# Patient Record
Sex: Male | Born: 1940 | Race: White | Hispanic: No | Marital: Single | Smoking: Never smoker
Health system: Southern US, Community
[De-identification: ages and names within clinical notes are randomized; demographics above are authoritative.]

## PROBLEM LIST (undated history)

## (undated) DIAGNOSIS — H9190 Unspecified hearing loss, unspecified ear: Secondary | ICD-10-CM

## (undated) DIAGNOSIS — H409 Unspecified glaucoma: Secondary | ICD-10-CM

## (undated) DIAGNOSIS — G629 Polyneuropathy, unspecified: Secondary | ICD-10-CM

## (undated) DIAGNOSIS — I10 Essential (primary) hypertension: Secondary | ICD-10-CM

## (undated) DIAGNOSIS — M51369 Other intervertebral disc degeneration, lumbar region without mention of lumbar back pain or lower extremity pain: Secondary | ICD-10-CM

## (undated) DIAGNOSIS — K2270 Barrett's esophagus without dysplasia: Secondary | ICD-10-CM

## (undated) DIAGNOSIS — K259 Gastric ulcer, unspecified as acute or chronic, without hemorrhage or perforation: Secondary | ICD-10-CM

## (undated) DIAGNOSIS — N200 Calculus of kidney: Secondary | ICD-10-CM

## (undated) DIAGNOSIS — Z8739 Personal history of other diseases of the musculoskeletal system and connective tissue: Secondary | ICD-10-CM

## (undated) DIAGNOSIS — D869 Sarcoidosis, unspecified: Secondary | ICD-10-CM

## (undated) DIAGNOSIS — E785 Hyperlipidemia, unspecified: Secondary | ICD-10-CM

## (undated) DIAGNOSIS — M5136 Other intervertebral disc degeneration, lumbar region: Secondary | ICD-10-CM

## (undated) DIAGNOSIS — E119 Type 2 diabetes mellitus without complications: Secondary | ICD-10-CM

## (undated) HISTORY — PX: LYMPH NODE DISSECTION: SHX5087

## (undated) HISTORY — PX: APPENDECTOMY: SHX54

## (undated) HISTORY — PX: NECK SURGERY: SHX720

## (undated) HISTORY — PX: CATARACT EXTRACTION: SUR2

## (undated) HISTORY — PX: BACK SURGERY: SHX140

## (undated) HISTORY — PX: ORIF ANKLE FRACTURE: SHX5408

## (undated) HISTORY — PX: OTHER SURGICAL HISTORY: SHX169

---

## 2003-08-14 ENCOUNTER — Encounter: Admission: RE | Admit: 2003-08-14 | Discharge: 2003-08-14 | Payer: Self-pay | Admitting: Neurosurgery

## 2003-08-28 ENCOUNTER — Encounter: Admission: RE | Admit: 2003-08-28 | Discharge: 2003-08-28 | Payer: Self-pay | Admitting: Neurosurgery

## 2004-02-04 ENCOUNTER — Ambulatory Visit: Payer: Self-pay | Admitting: Internal Medicine

## 2004-03-30 ENCOUNTER — Ambulatory Visit: Payer: Self-pay | Admitting: Internal Medicine

## 2004-06-07 ENCOUNTER — Ambulatory Visit: Payer: Self-pay | Admitting: Family Medicine

## 2004-06-09 ENCOUNTER — Ambulatory Visit: Payer: Self-pay | Admitting: Family Medicine

## 2004-07-29 ENCOUNTER — Ambulatory Visit: Payer: Self-pay | Admitting: Internal Medicine

## 2005-01-27 ENCOUNTER — Ambulatory Visit: Payer: Self-pay | Admitting: Internal Medicine

## 2006-06-20 ENCOUNTER — Ambulatory Visit: Payer: Self-pay

## 2006-07-31 ENCOUNTER — Encounter: Payer: Self-pay | Admitting: Internal Medicine

## 2006-09-04 ENCOUNTER — Ambulatory Visit: Payer: Self-pay | Admitting: Internal Medicine

## 2006-10-05 DIAGNOSIS — E785 Hyperlipidemia, unspecified: Secondary | ICD-10-CM

## 2006-10-05 DIAGNOSIS — Z860100 Personal history of colon polyps, unspecified: Secondary | ICD-10-CM | POA: Insufficient documentation

## 2006-10-05 DIAGNOSIS — D869 Sarcoidosis, unspecified: Secondary | ICD-10-CM

## 2006-10-05 DIAGNOSIS — I10 Essential (primary) hypertension: Secondary | ICD-10-CM | POA: Insufficient documentation

## 2006-10-05 DIAGNOSIS — E1165 Type 2 diabetes mellitus with hyperglycemia: Secondary | ICD-10-CM

## 2006-10-05 DIAGNOSIS — K5730 Diverticulosis of large intestine without perforation or abscess without bleeding: Secondary | ICD-10-CM | POA: Insufficient documentation

## 2006-10-12 ENCOUNTER — Ambulatory Visit: Payer: Self-pay | Admitting: Internal Medicine

## 2006-11-08 ENCOUNTER — Ambulatory Visit (HOSPITAL_COMMUNITY): Admission: RE | Admit: 2006-11-08 | Discharge: 2006-11-08 | Payer: Self-pay | Admitting: Neurosurgery

## 2006-12-06 ENCOUNTER — Encounter: Payer: Self-pay | Admitting: Neurosurgery

## 2006-12-13 ENCOUNTER — Encounter: Payer: Self-pay | Admitting: Neurosurgery

## 2007-02-14 ENCOUNTER — Encounter (INDEPENDENT_AMBULATORY_CARE_PROVIDER_SITE_OTHER): Payer: Self-pay | Admitting: *Deleted

## 2007-04-13 ENCOUNTER — Ambulatory Visit: Payer: Self-pay | Admitting: Internal Medicine

## 2007-04-13 DIAGNOSIS — R109 Unspecified abdominal pain: Secondary | ICD-10-CM | POA: Insufficient documentation

## 2007-05-16 ENCOUNTER — Ambulatory Visit: Payer: Self-pay | Admitting: Internal Medicine

## 2007-05-16 DIAGNOSIS — F329 Major depressive disorder, single episode, unspecified: Secondary | ICD-10-CM

## 2007-05-16 DIAGNOSIS — E1149 Type 2 diabetes mellitus with other diabetic neurological complication: Secondary | ICD-10-CM

## 2007-05-17 LAB — CONVERTED CEMR LAB
Eosinophils Relative: 0.7 % (ref 0.0–5.0)
GFR calc Af Amer: 71 mL/min
GFR calc non Af Amer: 59 mL/min
Glucose, Bld: 496 mg/dL — ABNORMAL HIGH (ref 70–99)
Hgb A1c MFr Bld: 12.8 % — ABNORMAL HIGH (ref 4.6–6.0)
Lymphocytes Relative: 25.5 % (ref 12.0–46.0)
MCHC: 33.6 g/dL (ref 30.0–36.0)
MCV: 89 fL (ref 78.0–100.0)
Microalb, Ur: 3.3 mg/dL — ABNORMAL HIGH (ref 0.0–1.9)
Monocytes Relative: 4.7 % (ref 3.0–11.0)
Neutrophils Relative %: 68.6 % (ref 43.0–77.0)
PSA: 0.51 ng/mL (ref 0.10–4.00)
Phosphorus: 5.2 mg/dL — ABNORMAL HIGH (ref 2.3–4.6)
Platelets: 222 10*3/uL (ref 150–400)
Potassium: 4.4 meq/L (ref 3.5–5.1)
Sodium: 130 meq/L — ABNORMAL LOW (ref 135–145)
TSH: 1.9 microintl units/mL (ref 0.35–5.50)

## 2007-05-18 ENCOUNTER — Encounter: Payer: Self-pay | Admitting: Internal Medicine

## 2007-05-31 ENCOUNTER — Ambulatory Visit: Payer: Self-pay | Admitting: Internal Medicine

## 2007-06-05 ENCOUNTER — Other Ambulatory Visit: Payer: Self-pay

## 2007-06-05 ENCOUNTER — Inpatient Hospital Stay: Payer: Self-pay | Admitting: Internal Medicine

## 2007-06-06 ENCOUNTER — Encounter: Payer: Self-pay | Admitting: Internal Medicine

## 2007-06-08 ENCOUNTER — Encounter: Payer: Self-pay | Admitting: Internal Medicine

## 2007-06-11 ENCOUNTER — Encounter: Payer: Self-pay | Admitting: Internal Medicine

## 2007-06-12 ENCOUNTER — Telehealth: Payer: Self-pay | Admitting: Internal Medicine

## 2007-06-12 ENCOUNTER — Encounter: Payer: Self-pay | Admitting: Internal Medicine

## 2007-06-15 DIAGNOSIS — M8618 Other acute osteomyelitis, other site: Secondary | ICD-10-CM

## 2007-06-18 ENCOUNTER — Ambulatory Visit: Payer: Self-pay | Admitting: Internal Medicine

## 2007-06-18 DIAGNOSIS — B951 Streptococcus, group B, as the cause of diseases classified elsewhere: Secondary | ICD-10-CM

## 2007-06-18 DIAGNOSIS — K259 Gastric ulcer, unspecified as acute or chronic, without hemorrhage or perforation: Secondary | ICD-10-CM | POA: Insufficient documentation

## 2007-06-19 ENCOUNTER — Encounter: Payer: Self-pay | Admitting: Internal Medicine

## 2007-07-04 ENCOUNTER — Ambulatory Visit: Payer: Self-pay | Admitting: Internal Medicine

## 2007-07-04 ENCOUNTER — Emergency Department: Payer: Self-pay | Admitting: Unknown Physician Specialty

## 2007-07-04 ENCOUNTER — Telehealth: Payer: Self-pay | Admitting: Internal Medicine

## 2007-07-04 ENCOUNTER — Ambulatory Visit: Payer: Self-pay | Admitting: Pulmonary Disease

## 2007-07-04 ENCOUNTER — Inpatient Hospital Stay (HOSPITAL_COMMUNITY): Admission: EM | Admit: 2007-07-04 | Discharge: 2007-07-18 | Payer: Self-pay | Admitting: Emergency Medicine

## 2007-07-04 ENCOUNTER — Encounter: Payer: Self-pay | Admitting: Internal Medicine

## 2007-07-05 ENCOUNTER — Ambulatory Visit: Payer: Self-pay | Admitting: Internal Medicine

## 2007-07-10 ENCOUNTER — Telehealth (INDEPENDENT_AMBULATORY_CARE_PROVIDER_SITE_OTHER): Payer: Self-pay | Admitting: *Deleted

## 2007-07-13 ENCOUNTER — Encounter (INDEPENDENT_AMBULATORY_CARE_PROVIDER_SITE_OTHER): Payer: Self-pay | Admitting: Neurosurgery

## 2007-07-13 ENCOUNTER — Encounter: Payer: Self-pay | Admitting: Internal Medicine

## 2007-07-18 ENCOUNTER — Encounter: Payer: Self-pay | Admitting: Internal Medicine

## 2007-07-19 ENCOUNTER — Encounter (INDEPENDENT_AMBULATORY_CARE_PROVIDER_SITE_OTHER): Payer: Self-pay | Admitting: *Deleted

## 2007-07-23 ENCOUNTER — Encounter: Payer: Self-pay | Admitting: Infectious Diseases

## 2007-07-23 ENCOUNTER — Ambulatory Visit: Payer: Self-pay

## 2007-07-23 ENCOUNTER — Encounter: Payer: Self-pay | Admitting: Internal Medicine

## 2007-07-31 ENCOUNTER — Encounter: Payer: Self-pay | Admitting: Internal Medicine

## 2007-07-31 ENCOUNTER — Ambulatory Visit: Payer: Self-pay

## 2007-08-02 ENCOUNTER — Encounter: Payer: Self-pay | Admitting: Internal Medicine

## 2007-08-03 ENCOUNTER — Ambulatory Visit: Payer: Self-pay | Admitting: Internal Medicine

## 2007-08-08 ENCOUNTER — Telehealth: Payer: Self-pay | Admitting: Internal Medicine

## 2007-08-13 ENCOUNTER — Encounter: Payer: Self-pay | Admitting: Internal Medicine

## 2007-08-14 ENCOUNTER — Encounter: Payer: Self-pay | Admitting: Internal Medicine

## 2007-08-24 ENCOUNTER — Encounter: Payer: Self-pay | Admitting: Internal Medicine

## 2007-09-07 ENCOUNTER — Encounter: Payer: Self-pay | Admitting: Internal Medicine

## 2007-09-11 ENCOUNTER — Encounter: Payer: Self-pay | Admitting: Infectious Diseases

## 2007-09-11 ENCOUNTER — Encounter: Payer: Self-pay | Admitting: Internal Medicine

## 2007-09-12 ENCOUNTER — Inpatient Hospital Stay (HOSPITAL_COMMUNITY): Admission: AD | Admit: 2007-09-12 | Discharge: 2007-09-19 | Payer: Self-pay | Admitting: Neurosurgery

## 2007-10-08 ENCOUNTER — Ambulatory Visit: Payer: Self-pay | Admitting: Internal Medicine

## 2008-02-22 ENCOUNTER — Ambulatory Visit: Payer: Self-pay | Admitting: Family Medicine

## 2008-02-22 DIAGNOSIS — T23219A Burn of second degree of unspecified thumb (nail), initial encounter: Secondary | ICD-10-CM | POA: Insufficient documentation

## 2008-02-25 ENCOUNTER — Ambulatory Visit: Payer: Self-pay | Admitting: Internal Medicine

## 2008-02-25 DIAGNOSIS — L02519 Cutaneous abscess of unspecified hand: Secondary | ICD-10-CM

## 2008-02-25 DIAGNOSIS — L03119 Cellulitis of unspecified part of limb: Secondary | ICD-10-CM

## 2008-09-18 ENCOUNTER — Emergency Department: Payer: Self-pay | Admitting: Emergency Medicine

## 2009-11-10 IMAGING — CT CT CERVICAL SPINE WITHOUT CONTRAST
1 series · 12 of 14 positions shown, 15 images · non-contrast
Comparison: none

REASON FOR EXAM: neck pain arm pain
COMMENTS:

[Series 5: axial · axial · 0.31mm/px · z∈[+558,+727]mm · 12 of 107 slices shown, 15 images]
[im 9/107  soft-tissue]
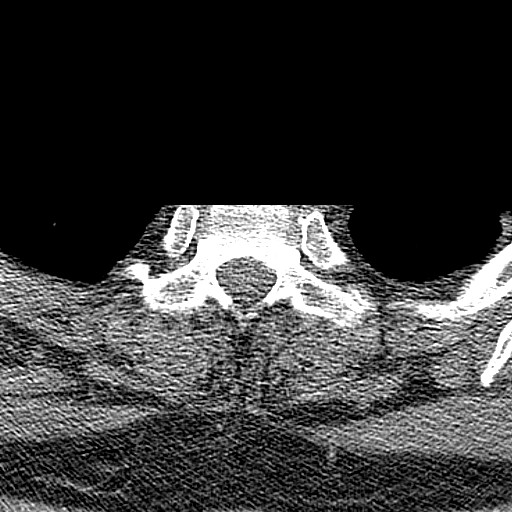
[im 9/107  bone]
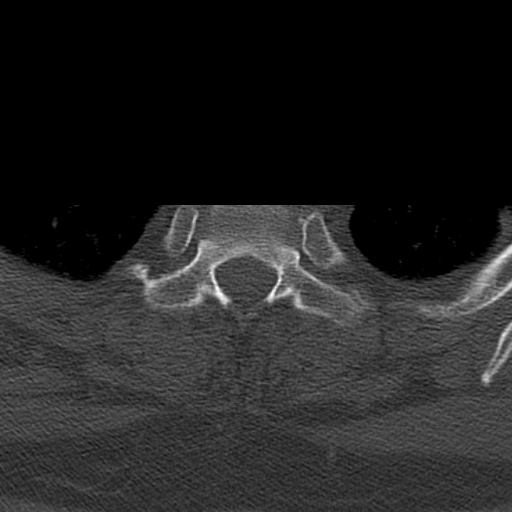
[im 17/107  bone]
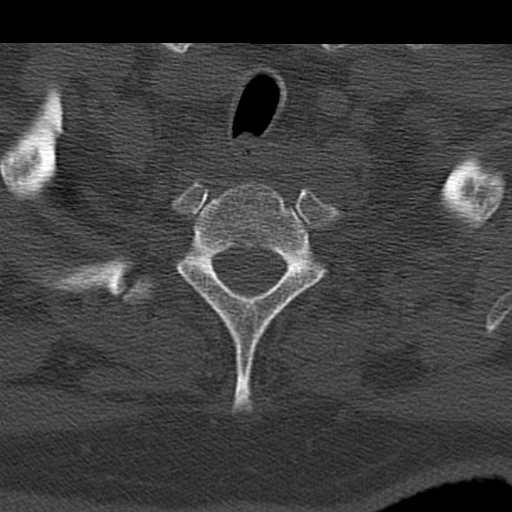
[im 25/107  bone]
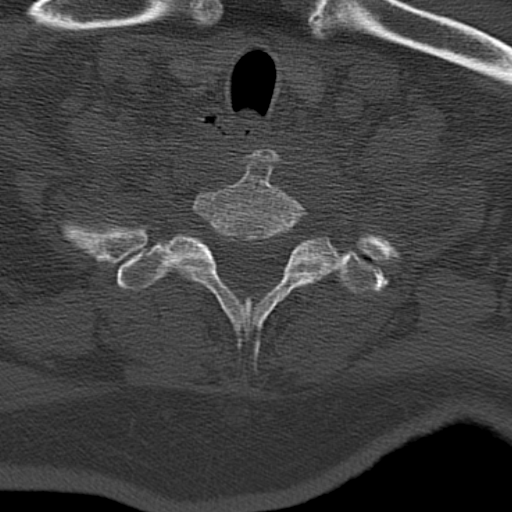
[im 33/107  bone]
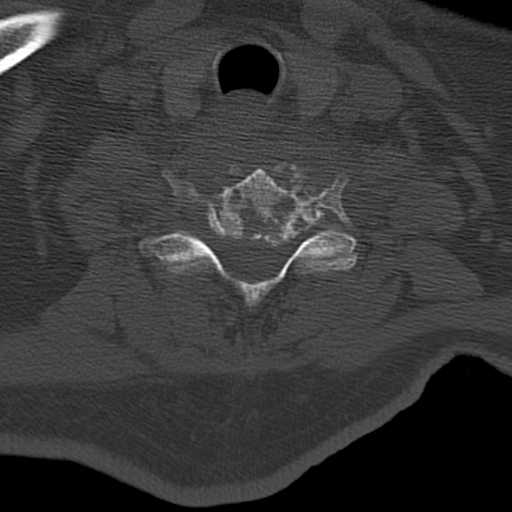
[im 41/107  soft-tissue]
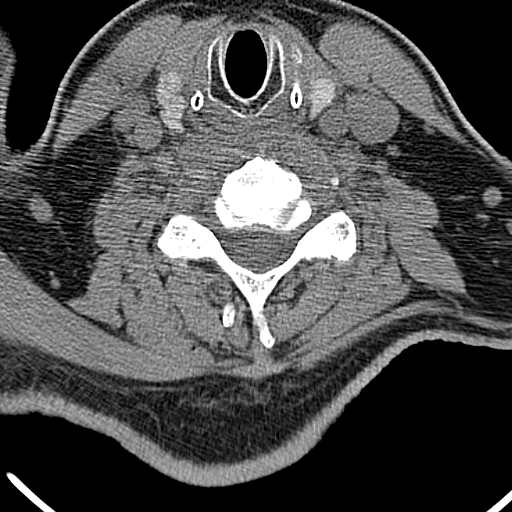
[im 41/107  bone]
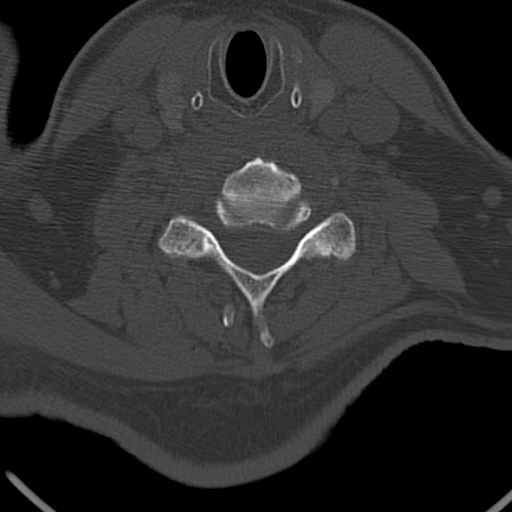
[im 49/107  bone]
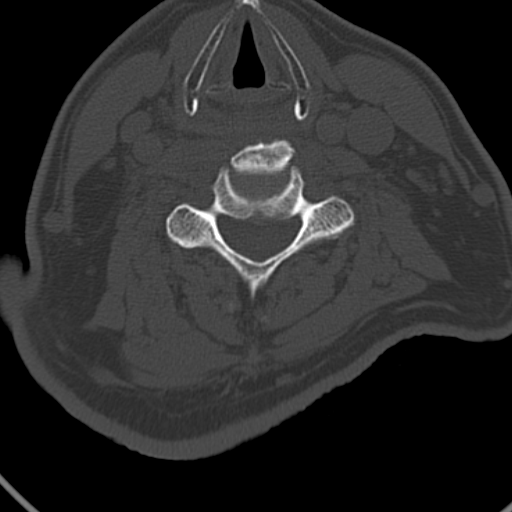
[im 58/107  bone]
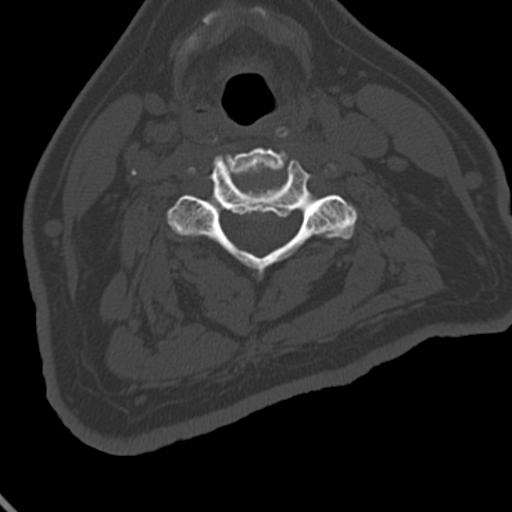
[im 66/107  bone]
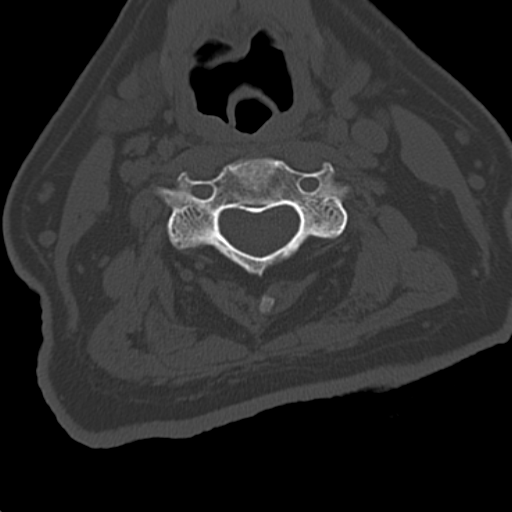
[im 74/107  soft-tissue]
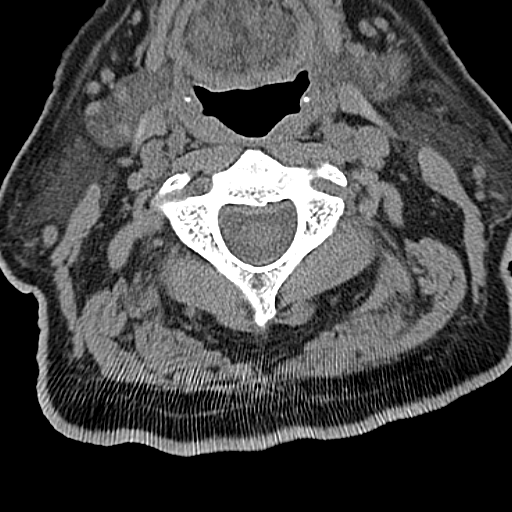
[im 74/107  bone]
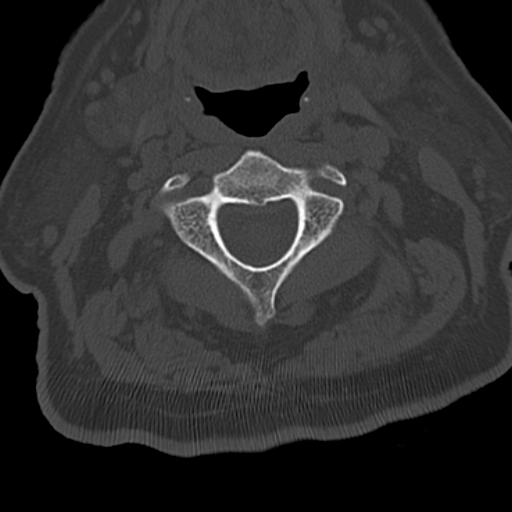
[im 82/107  bone]
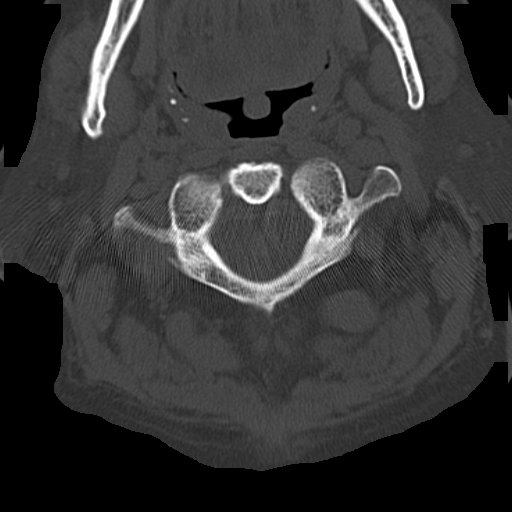
[im 90/107  bone]
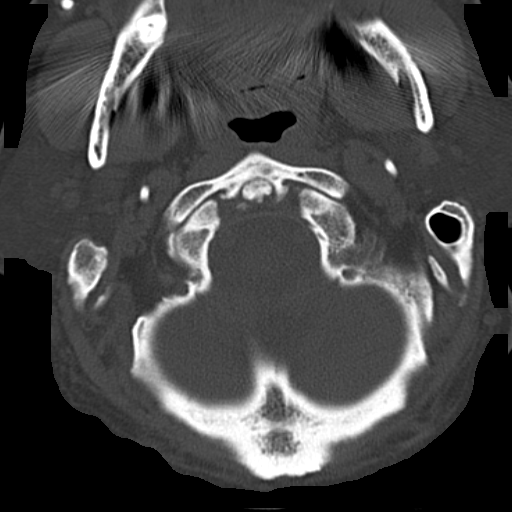
[im 98/107  bone]
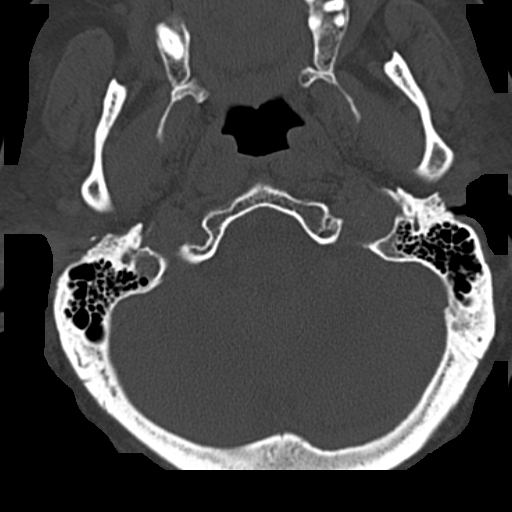

[12 of 14 positions shown; findings below may reference images not displayed]

PROCEDURE:     CT  - CT CERVICAL SPINE WO  - July 04, 2007  [DATE]

RESULT:     Sagittal, axial, and coronal images are reviewed. The
cervicovertebral bodies are preserved in height except at C6-C7 where there
appears to be destructive change of the inferior aspect of C6 and the
superior aspect of C7 with marked irregularity and partial obliteration of
the disc space. Bony density is seen both anterior and posterior to these
vertebral bodies. I do not see high-grade encroachment upon the spinal
canal. At  C4-C5 there is a very large anterior bridging osteophyte. The
prevertebral soft tissue spaces are somewhat prominent in the lower cervical
spine adjacent to the C5,C6 and C7. I do not see definite abnormal gas
collections in the prevertebral soft tissue spaces. No high-grade bony
spinal stenosis is seen at any level. The bony ring at each cervical level
is intact.
IMPRESSION: There is severe destructive change disc and adjacent
endplates at C6-C7. Correlation with the patient's clinical symptoms would
be of value. I do not see high-grade encroachment upon the bony spinal canal
at this level.
 2. There is degenerative change at C4-C5 with a large anterior bridging
osteophyte.

 Further evaluation with MRI would be useful. Nuclear bone scanning may be
of value is well. Are there clinical findings that are suspicious for
infection? Review of an MRI dated 20 June, 2006 reveals the findings at C6-C7
to be new. The possibility of an entity such as discitis and or vertebral
osteomyelitis is raised.

## 2009-11-12 IMAGING — CR DG CHEST 1V PORT
1 series · 1 of 1 positions shown · non-contrast
Comparison: 11/03/2006.

CLINICAL DATA: Right PICC line placement.

PORTABLE CHEST - 1 VIEW

[view not recorded]
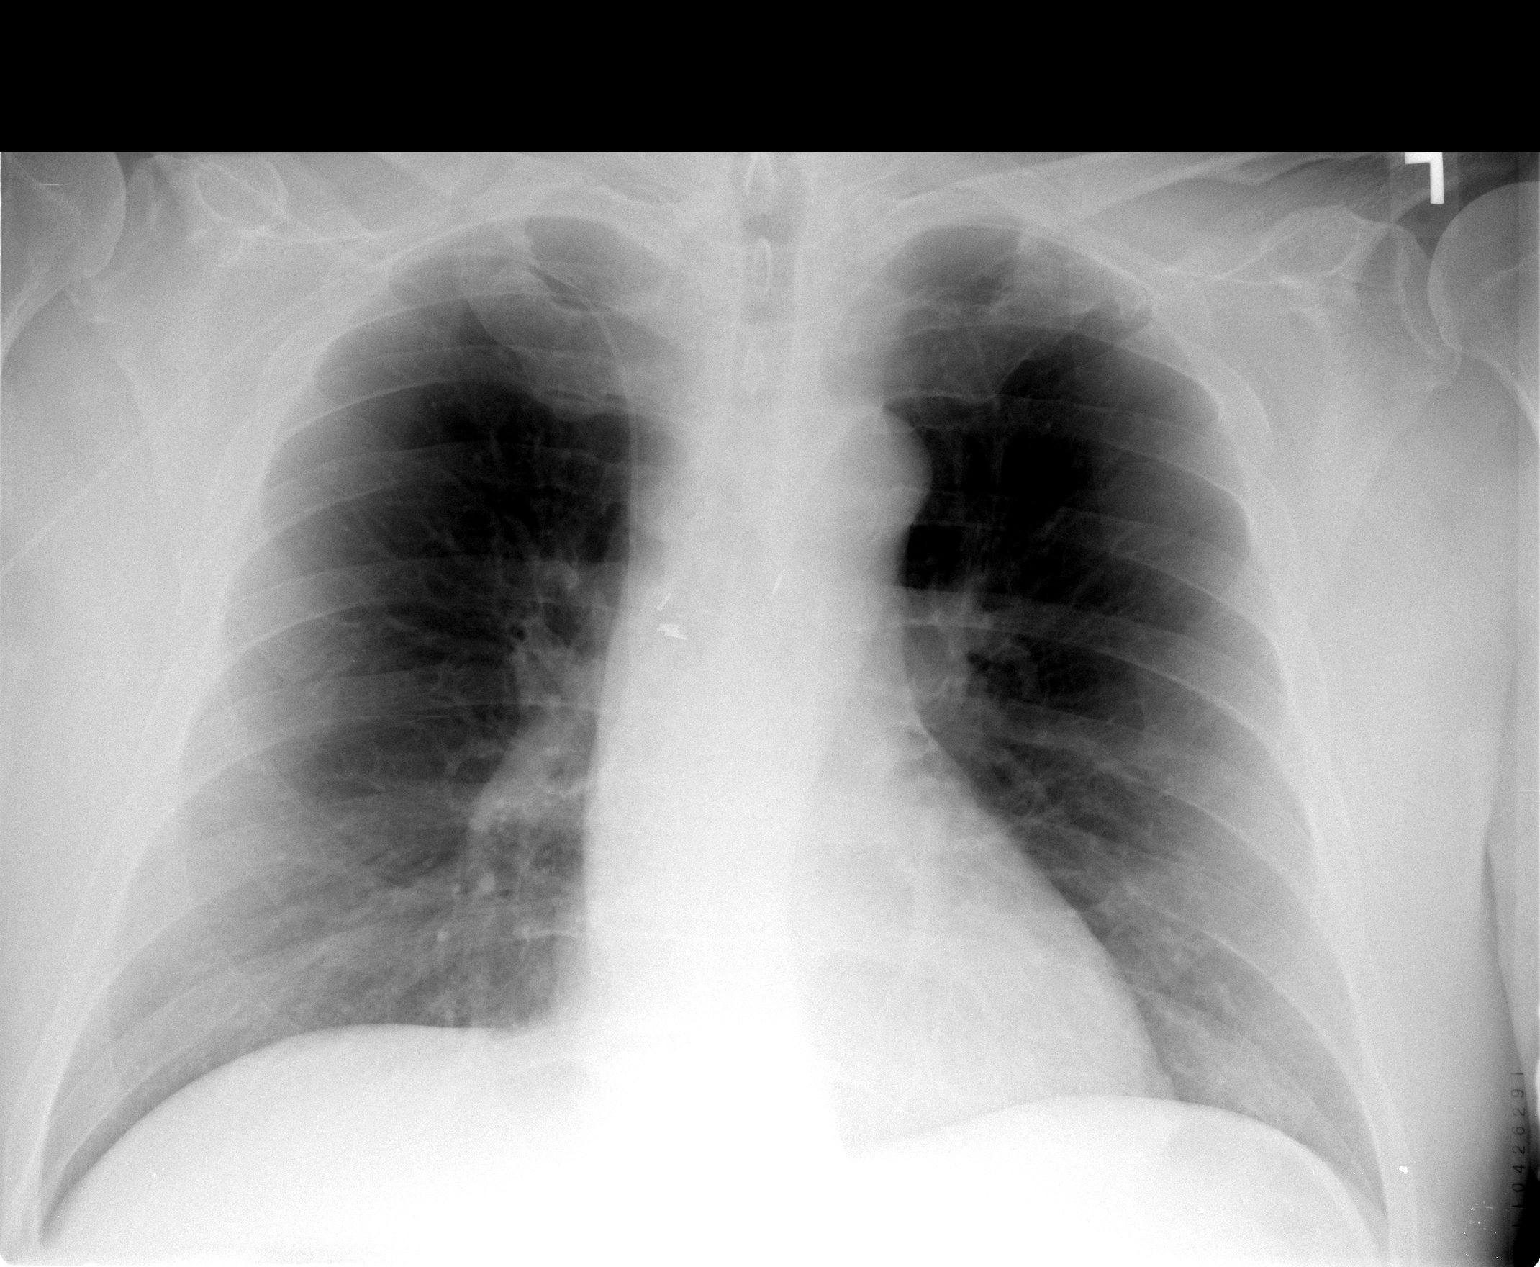

[1 of 1 positions shown; findings below may reference images not displayed]

FINDINGS: Right PICC line tip caval atrial junction.  To be within
the distal superior vena cava, this can be retracted by 3.4 cm.
Floor has been contacted.  Surgical clips project over the carina.
Atypical appearance of the left first rib felt to be responsible
for opacity along the peripheral aspect left upper lung zone.  This
can be confirmed on follow-up.  No infiltrate, congestive heart
failure or pneumothorax.  Torturuous aorta.
IMPRESSION: Right PICC line tip caval atrial junction.  This can be retracted
by 3.4 cm to be within the distal superior vena cava.

Bony overgrowth left first rib probably explains opacity peripheral
aspect left upper lung zone.

## 2010-02-05 ENCOUNTER — Emergency Department: Payer: Self-pay | Admitting: Emergency Medicine

## 2010-07-27 NOTE — H&P (Signed)
[name], Jesus NO.:  [id number]   MEDICAL RECORD NO.:  [id number]          PATIENT TYPE:  INP   LOCATION:  3034                         FACILITY:  [facility]   PHYSICIAN:  Coletta Memos, M.D.     DATE OF BIRTH:  12/19/1940   DATE OF ADMISSION:  09/12/2007  DATE OF DISCHARGE:                              HISTORY & PHYSICAL   ADMISSION DIAGNOSES:  1. 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  2. Increased weakness, right upper extremity.   INDICATIONS:  Mr. Jesus Alvarado was admitted by myself to the [facility] on July 04, 2007.  At that time, he had a lytic process at C6-  7, a left wrist drop, and a left C7 radiculopathy.  He had profound  weakness in his left upper extremity, especially in the triceps in the  C7 innervated musculature.  There were a number of things which occurred  prior to that, but in one sense he neglected it for at least a week  before he came in to see me in the office.  After being seen in the  office, I admitted him to the hospital.  A CT and MRI showed lytic  process of C6-7.  An MRI strongly suggested osteomyelitis at those 2  levels.  He was treated with antibiotics for a week without improvement  in the MRI scan.  That actually looked worse.  I therefore took him to  the operating room where he underwent a 2-level corpectomy of C6-C7.  Postoperatively, he was sent home on Jul 18, 2007.  He did follow up and  received all of his antibiotic treatment.  But his medical power of  attorney, and I spoke today, and she stated that he did not do anything  else.  He never followed up with me in my office for his postoperative  care.  He says that he went to see another orthopedic surgeon and  physician in [place], and they did x-rays and showed that there was  something wrong.  They also said well you are still under Dr. Sueanne Margarita  care, so you have to go see him.  We received a call yesterday stating  that he needed  to be seen, and he was worked emergently on my office  schedule.   Jesus Alvarado came in today stating that he is not taking any of his  medications.  On discharge, [name] had a number of medications outside  of the antibiotics including Zocor, Elavil, fluoxetine, Prozac, Coreg,  isosorbide mononitrate, Carafate, Protonix, Norvasc, glipizide, and  metformin, and none of those will be taken now.  His health care power  of attorney stated that he thinks that he has not been able to bathe, he  cannot use either hand at this point in time because of his weakness  that he had in his left continues, and is developing in his right hand.  On admission and discharge, he only had mild weakness in a grip in the  April-May  hospitalization in his right hand.   Jesus Alvarado also only  wore a cervical collar for 10 days after his  operation.  He was given instructions to have the collar along whenever  he was out of bed and then to see me in approximately 3-4 weeks after  the operation and that never occurred.  Jesus Alvarado was still quite nice,  very pleasant, and not in the least bit combative, but he is  extraordinarily noncompliant and again seems to have led something  fester until now it is a big problem.  I performed an x-ray in my office  which showed that the plate had pulled out of C5.  Tear portion of the  remaining bone at C6.   I therefore had [name] come over to [facility] so  that we  could do a CT and workup what was going on with regards to his neck.   ALLERGIES:  He has no known drug allergies.   MEDICATIONS:  He is taking absolutely no medications at this point in  time.   REVIEW OF SYSTEMS:  Positive for increasing weakness in his right upper  extremity and weakness in his left upper extremity.  He says he is  without neck pain.  He denies constitutional eye, ear, nose, throat,  mouth, cardiovascular, respiratory, gastrointestinal, genitourinary,  skin, psychiatric,  endocrinological, hematologic, or allergic symptoms.   Jesus Alvarado lives by himself.  He is still driving even though he cannot use  his hands.  He has had multiple operations.  I performed a carpal tunnel  release on his right hand.  He had the cervical corpectomy.   SOCIAL HISTORY:  Jesus Alvarado does not smoke.  He does not use alcohol.  He  does not use illicit drugs.  He is 70 years of age and retired.   PHYSICAL EXAMINATION:  On examination, he is 6 feet 2 inches tall.  He  weighs approximately 214 pounds, temperature 97.9, pulse 104,  respiratory rate 18, and blood pressure 142/99.  He is alert and  oriented x 4 and answering all questions appropriately.  He has 0/5  strength in the right wrist and left wrist.  He has 2/5 grip in his left  hand, 2/5 triceps on his left side, 2/5 biceps on his right side.  He  has 4/5 triceps on the right, 4+ to 5-/5 biceps on the left side.  Deltoids are approximately 5-/5 bilaterally.  Intrinsics are also  essentially 0/5 in both hands at this point in time.  Normal strength in  the lower extremities.  Proprioception is intact.  Pinprick is intact.  His lung fields are clear.  Heart, regular rhythm and rate.  No murmurs  or rub.  Pulses good at the wrists bilaterally.  Tongue and uvula in the  midline.  Shoulder shrug is normal.  Hearing intact to voice  bilaterally.   I will have Jesus Alvarado undergo CT scan.  He will also undergo an MRI scan.  I plan on taking [name] to the operating room tomorrow for revision of  the anterior cervical plate.           ______________________________  Coletta Memos, M.D.     KC/MEDQ  D:  09/12/2007  T:  09/13/2007  Job:  [id number]

## 2010-07-27 NOTE — Discharge Summary (Signed)
NAMESUBHAN, HOOPES NO.:  [id number]   MEDICAL RECORD NO.:  [id number]          PATIENT TYPE:  INP   LOCATION:  3011                         FACILITY:  [facility]   PHYSICIAN:  Coletta Memos, M.D.     DATE OF BIRTH:  [birth year]   DATE OF ADMISSION:  07/04/2007  DATE OF DISCHARGE:  07/18/2007                               DISCHARGE SUMMARY   ADMITTING DIAGNOSES:  1. Lytic process, C6-C7.  2. Left wrist drop.  3. Left C7 radiculopathy.   DISCHARGE DIAGNOSES:  1. Osteomyelitis.  2. Diskitis C6-C7 with Group B Streptococcus.  He is currently on IV      medication.   DISCHARGE MEDICATIONS:  1. Rocephin.  2. Zocor 80 mg p.o. daily.  3. Elavil 25 mg p.o. nightly.  4. Fluoxetine.  5. Prozac 20 mg daily.  6. Coreg  6.25 mg p.o. b.i.d.  7. Isosorbide mononitrate 30 mg p.o. daily.  8. Carafate 1 gram p.o. b.i.d.  9. Protonix 40 mg p.o. daily.  10.Norvasc 10 mg p.o. daily.  11.Glipizide 10 mg p.o. b.i.d.  12.Metformin 1000 mg p.o. b.i.d.   HOSPITAL COURSE:  He received his pneumococcal vaccine while in the  hospital.  He was on a sliding-scale insulin and his Rocephin doses 2  grams IV q.24 h.  He is afebrile at discharge and has been afebrile for  number of days.  He never actually mounted much of white count or  responses in regard to his osteomyelitis.  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  He  still has profound weakness in his left triceps and wrist extensors.  The wrist extensors were essentially 0/5, grip was 2/5, and intrinsics  2/5.  He has 3/5 triceps all on the left side.  Right side, mild  weakness and his right grip 5-/5, normal strength in the lower  extremities.  Normal bowel and bladder function.  His wound was clean  and dry without signs of infection.  Given instructions to use the  cervical collar whenever he is out of bed.  He will be discharged to  home.  He will have home nursing for  his IV antibiotic treatment this  weekend and will go to an IV clinic at [facility].  He will receive  outpatient occupational therapy also.  I will see him back in  approximately 3 to 4 weeks.   Mr. Jesus Alvarado was admitted secondary to his process occurring at C6-C7 and a  month long history of neck pain.  He was thought to have some problem  with his esophagus, but that was never diagnosed here in this hospital.  It was done at [facility] and he was placed on penicillin.  He received  an MRI of the cervical spine and it showed a lytic process and possible  phlegmon, and diskitis at C6-C7.  Secondary to finding on blood cultures  done in March, I believe showing group B beta strep, the ID team here  placed him on appropriate antibiotics to that.  They had sensitivities  available from [facility].  After approximately 1  week of treatment, I repeated the cervical MRI and it actually showed  progression of the infection.  I therefore, at that time, took him to  the operating room, where he underwent a corpectomy, anterior plating of  what I believed to be C6 and the disk at C5-C6 and C6-C7.  I did not do  a postoperative CT and I made very well to take more bone as there was a  great deal of collapse and clearly remove both levels.  That will be  determined later but he is away and he has been doing well.  He had no  increase in the strength of his left upper extremity, but he had a great  deal of decrease in the pain that he was having.           ______________________________  Coletta Memos, M.D.     KC/MEDQ  D:  07/18/2007  T:  07/19/2007  Job:  [id number]

## 2010-07-27 NOTE — Op Note (Signed)
NAMEJALANI, ROMINGER NO.:  [id number]   MEDICAL RECORD NO.:  [id number]          PATIENT TYPE:  INP   LOCATION:  3011                         FACILITY:  [facility]   PHYSICIAN:  Coletta Memos, M.D.     DATE OF BIRTH:  [birth year]   DATE OF PROCEDURE:  07/13/2007  DATE OF DISCHARGE:                               OPERATIVE REPORT   PREOPERATIVE DIAGNOSES:  1. Osteomyelitis, C6-C7.  2. Diskitis, C6-C7.  3. Cervical epidural abscess.  4. Spinal cord compression.   POSTOPERATIVE DIAGNOSES:  1. Osteomyelitis, C6-C7.  2. Diskitis, C6-C7.  3. Cervical epidural abscess.  4. Spinal cord compression.   PROCEDURE:  1. C6-C7 corpectomy.  2. Arthrodesis, C6-T1.  3. Decompression of spinal canal.  4. Microdissection.   SURGEON:  Coletta Memos, MD   ASSISTANT:  Danae Orleans. Venetia Maxon, MD   INDICATIONS:  Jesus Alvarado is a gentleman who presented to my service  with evidence of weakness in his left upper extremity for approximately  1 week and subsequent findings on MRI of an epidural abscess at C6 and  C7 with associated diskitis.  He had profound C7 weakness on the left  side with wrist drop, weak triceps, only antigravity and extraordinarily  weak intrinsics.  After a week's worth of antibiotics, a repeat MRI was  performed.  The diskitis had actually progressed since the initial MRI.  At that point, I elected to take Mr. Pile to the operating room for  decompression and corpectomies.   OPERATIVE NOTE:  [name] was brought to the operating room, intubated  and placed under general anesthetic.  Foley catheter was placed under  sterile conditions.  His head was placed on a horseshoe headrest,  essentially neutral position.  His neck was prepped, and he was draped  in a sterile fashion.  I opened the skin with a #10 blade, and I took  this down to the platysma.  I opened the platysma horizontally and  dissected rostrally and caudally to increase my working area.  I then  was able  with blunt and sharp dissection to go through what was very  thick tissue in the prevertebral space, certainly thicker than is normal  until I was able to find the cervical spine.  Took x-rays, and I was  able to confirm that I was at the C5-C6 space, and then moved down to  the level of C6-C7.  At that point in time, what I did was remove all of  the diseased bone.  Frankly, could not worry about the level as the  infection was extensive, and I again just drilled away wall of the bone  that was bad until I got back to a clean edge.  I was able to do that  and at the same time fully decompress the spinal canal using high-speed  drills, Kerrison punches, and microdissection.  The disk was removed  without great difficulty.  I did this with Dr. Fredrich Birks assistance.   I certainly believe that C6 and C7 vertebral bodies were removed, and I  did have in fact very good  decompression.   I prepared for the arthrodesis by evening out my bony borders.  I then  was able to place a PEEK interbody filled with nothing, as I did not  want to introduce another foreign body into this pace.  This was a tight  fit.  I took an x-ray, and with poor visualization I was not able to see  that.  I then placed a plate overlying this and secured 2 screws in the  upper and lower vertebral bodies, which were intact.  I irrigated the  wound.  I did leave a drain in place, which I placed subcutaneously into  the prevertebral space.  I then closed the wound in a layered fashion  using Vicryl sutures to reapproximate the platysma and subcuticular  layers.  I secured the drain to the skin edges with a stitch.  I then  was able to place Dermabond for sterile dressing.  The patient was  extubated and moving all of his extremities postoperatively.           ______________________________  Coletta Memos, M.D.     KC/MEDQ  D:  07/25/2007  T:  07/26/2007  Job:  [id number]

## 2010-07-27 NOTE — Op Note (Signed)
NAMEKAYSON, TASKER                ACCOUNT NO.:  [id number]   MEDICAL RECORD NO.:  [id number]          PATIENT TYPE:  AMB   LOCATION:  SDS                          FACILITY:  [facility]   PHYSICIAN:  Coletta Memos, M.D.     DATE OF BIRTH:  02-01-1941   DATE OF PROCEDURE:  11/08/2006  DATE OF DISCHARGE:                               OPERATIVE REPORT   PREOPERATIVE DIAGNOSIS:  Carpal tunnel disease, right side.   POSTOPERATIVE DIAGNOSIS:  Carpal tunnel disease, right side.   PROCEDURE:  Right carpal tunnel release.   INDICATIONS:  Mr. Earhart presented with severe carpal tunnel disease both  hands, right worse than left.  He felt that his right hand was hurting  worse than the left.  We, therefore, elected to do the right side first.   DESCRIPTION OF PROCEDURE:  Mr. Boxley was placed under general anesthetic  as he said he could not tolerate IV sedation.  His right upper extremity  was prepped and draped in a sterile fashion.  I did infiltrate 7 mL  0.5% lidocaine with 1:200,000 epinephrine into the right hand starting  at the distal palmar crease extending into the hand with an ulnar  deviation about an inch or so.  I opened the skin with a #15 blade and  took this down to the transverse carpal ligament.  I placed a self-  retaining retractor.  Then using a mosquito mosquito to protect  underlying contents.  I transected the transverse carpal ligament.  I  did this from the proximal palmar crease into the hand to effect full  decompression of the right median nerve.  I then irrigated after  inspecting my decompression and feeling that it was adequate it was  sufficient.  I irrigated the wound.  I then closed the wound in a single  layer using both interrupted vertical mattress sutures and two simple  interrupted sutures.  A sterile dressing was applied.  The patient was  extubated postoperatively.           ______________________________  Coletta Memos, M.D.     KC/MEDQ  D:  11/08/2006   T:  11/09/2006  Job:  [id number]

## 2010-07-27 NOTE — H&P (Signed)
NAMEOSMOND, STECKMAN NO.:  [id number]   MEDICAL RECORD NO.:  [id number]          PATIENT TYPE:  INP   LOCATION:  3013                         FACILITY:  [facility]   PHYSICIAN:  Coletta Memos, M.D.     DATE OF BIRTH:  Aug 08, 1940   DATE OF ADMISSION:  07/04/2007  DATE OF DISCHARGE:                              HISTORY & PHYSICAL   ADMITTING DIAGNOSES:  1. Lytic process, C6-C7.  2. Left wrist drop.  3. Left C7 radiculopathy.   INDICATIONS:  Jesus Alvarado is a 70 year old gentleman whom I have taken  care of by performing a right carpal tunnel procedure in the past.  He  states that over the last week he has been unable to use his left wrist.  This has been since last week Thursday.  When asked why he did not go to  the hospital, he says Well, I was just being ignorant.  When he did  not go to the hospital on Friday, Saturday, Sunday, or Monday, he says  Well, you know, it was just being a silly doctor.  He came to the  hospital today specifically because he fell while at a sporting goods  store onto his side.  He says that he felt kind of dizzy headed and this  has happened before about a month ago.  And as a result of that fall, he  was taken to the hospital.  He did go home afterwards and had his friend  call his primary care physician's office, Jesus Alvarado.  Jesus Alvarado  informed him that he should be taken by ambulance to the hospital.   Jesus Alvarado states that he was in [place] about a month ago, where he had  an extensive workup for neck pain, which he has had now for well over a  month.  He had multiple coronary exams and other tests, none of which  showed any problem.  His neck pain was unresolved when he was discharged  from the hospital.  He stated he was in the hospital for approximately 8  days.  That information I do not have other than his recollection.  While in the emergency room today, he was noted to have profound  weakness in his triceps and even worse  in his left wrist, essentially a  wrist drop.  His lower extremities, right upper extremity worked just  fine.  He had a number of tests, the erythrocyte sedimentation rate  which was elevated 47.  He had a head CT, which was normal.  He also had  a cervical spine CT, which showed a lytic process at C6 and C7 seemingly  centered around the disk space.  His white count however was 10.2.  Hemoglobin 12.4.  He does have a history of diabetes and hypertension,  and his serum glucose was 267.  Urinary function normal with a BUN of  26, slightly elevated given the normal range at [place] of 7-20 and a  creatinine of 0.91.  But, sodium and potassium 137 and 4.4 were both  normal.  The rest of his tests were  unremarkable.  He had an EKG  performed at [facility], which was normal.   MEDICATIONS:  Jesus Alvarado takes a number of medications of which he says run  into double digits.  He is unable to tell me any of the medications.   ALLERGIES:  HE HAS NO KNOWN DRUG ALLERGIES.   SOCIAL HISTORY:  He does not smoke.  He does not use alcohol.  He does  not use illicit drugs.  He is currently retired and 70 years of age.  He  does live by himself.   REVIEW OF SYSTEMS:  Jesus Alvarado stated he also had some tingling in his left  upper extremity.  He also has a history of hyperlipidemia.   He stated that the pain starts in the middle part of shoulder, was worse  at night hurting all night long.  Interestingly, he has no pain today in  his neck or in the left upper extremity.  He simply has a weakness  there.  The note from [facility] stated that he  fell yesterday. Jesus Alvarado told me had fallen today.   PHYSICAL EXAMINATION:  VITAL SIGNS:  He is 6 feet 2 inches weighing 214  pounds.  MUSCULOSKELETAL:  On examination, he is unable to form a complete fist.  He can flex his fingers minimally.  He has 0/5 wrist extensors and  finger extensors.  He has 0/5 finger abductors.  He has very poor   adduction.  Triceps is 3/5.  Biceps is approximately normal.  Deltoid is  normal.  Right upper extremity has normal strength.  He has normal  strength in the bilateral lower extremities.  No cervical masses or  bruits.  LUNGS:  Lung fields clear.  HEART:  Regular rhythm and rate.  No murmurs or rubs appreciated.  ABDOMEN:  Soft and nontender.  Bowel sounds present.  EXTREMITIES:  No clubbing, cyanosis, or edema.  In his left foot, he has  an ulcerated callus for which he has been wearing a foot boot.  He says  he has had this problem for well over a year.  He is seen in a wound  care clinic for this.  NEUROLOGIC:  He has normal speech.  Memory, language, attention and fund  of knowledge are normal.  HEENT:  Full extraocular movements.  Full visual fields.  Hearing intact  to finger rub bilaterally.  Tongue protrudes in the midline.  Uvula  elevates in the midline.  Light reflexes same.   IMAGING STUDIES:  CT of the head is normal.  CT of the cervical spine  shows a lytic process centered around the disk space at C6 and C7.  There is no bony compression of the spinal canal  and the neural  foramina are certainly patent.  He has a large osteophyte anteriorly at  the C4-C5 level.  Alignment is otherwise normal.   ASSESSMENT AND PLAN:  Jesus Alvarado has a process, which I believe is in the  neck which would be related to the weakness in the left upper extremity  and left hand, especially his wrist.  He needs an MRI, which will be  ordered with and without contrast tonight.  This problem being well over  a week now, I would not believe it to be emergent at this point in time.  But the diagnosis of this lytic process needs to be obtained.  He will  be admitted to the hospital.  ______________________________  Coletta Memos, M.D.     KC/MEDQ  D:  07/04/2007  T:  07/05/2007  Job:  [id number]

## 2010-07-27 NOTE — Discharge Summary (Signed)
NAMEADRION, MENZ NO.:  [id number]   MEDICAL RECORD NO.:  [id number]          PATIENT TYPE:  INP   LOCATION:  3034                         FACILITY:  [facility]   PHYSICIAN:  Coletta Memos, M.D.     DATE OF BIRTH:  Nov 18, 1940   DATE OF ADMISSION:  09/12/2007  DATE OF DISCHARGE:  09/19/2007                               DISCHARGE SUMMARY   ADMITTING DIAGNOSIS:  Hardware failure.   DISCHARGE DIAGNOSIS:  Hardware failure.   Jesus Alvarado is a gentleman whom I did a corpectomy on secondary to  osteomyelitis.  When he came back after not coming back for routine  follow up, the top end of his plate appeared to have very little  purchase.  I planned on taking him back to the operating room, but prior  to that ordered both a CT and an MRI.  The MRI showed that the interbody  was well within bone.  I did not have the complete corpectomy of C6 as I  thought when I did his case.  However, he had a full decompression of  the spinal canal.  He had absolutely no neck pain though he had  progressive weakness in his right upper extremity.  Looking at the MRI,  there was no evidence of compression at any of the levels.  Given the  fact that he had a huge operation and that he would require both a front  and back where I had to do them again, I do not feel it was worth taking  down what appears to be working for him in the sense of the graft and  the decompression just so I could make the x-ray prettier.  The x-ray  will be ugly and is ugly now, but neurologically he seems to be doing  just fine with this, and given that and given his propensity for not  necessarily coming back for followup, I do not think a front and back  operation which would have to cross with cervicothoracic junction was  necessary.  I had also wanted him to receive occupational therapy of  which he has received very little here at [facility], but he  certainly is ready to go.  He is going to wear the collar for  at least  another 2 months.  He had stopped wearing the collar after 10 days after  his first operation in April of his own accord.  So, at discharge he is  weak in both upper extremities, has weakness in both grips.  He has  normal strength in the lower extremities, normal bowel and bladder  function.  Sensation is intact.  His left upper extremity weaker than  right and that is stable.  The left side is stable since his discharge  in April.           ______________________________  Coletta Memos, M.D.     KC/MEDQ  D:  09/19/2007  T:  09/20/2007  Job:  [id number]

## 2010-12-07 LAB — DIFFERENTIAL
Basophils Absolute: 0
Basophils Relative: 1
Eosinophils Absolute: 0.1
Eosinophils Relative: 1
Monocytes Absolute: 0.5
Neutro Abs: 6.1

## 2010-12-07 LAB — COMPREHENSIVE METABOLIC PANEL
ALT: 17
AST: 14
AST: 17
Albumin: 3.4 — ABNORMAL LOW
Alkaline Phosphatase: 76
Alkaline Phosphatase: 83
BUN: 12
CO2: 25
Chloride: 100
Chloride: 101
Creatinine, Ser: 0.76
Creatinine, Ser: 0.89
GFR calc Af Amer: >60
GFR calc Af Amer: >60
GFR calc non Af Amer: >60
Potassium: 3.4 — ABNORMAL LOW
Potassium: 3.4 — ABNORMAL LOW
Total Bilirubin: 0.3
Total Bilirubin: 0.8
Total Protein: 6.8

## 2010-12-07 LAB — CBC
HCT: 34 — ABNORMAL LOW
MCV: 86.6
Platelets: 297
RBC: 3.93 — ABNORMAL LOW
RDW: 14.3
WBC: 6.8
WBC: 9.9

## 2010-12-07 LAB — CULTURE, BLOOD (ROUTINE X 2)

## 2010-12-07 LAB — BASIC METABOLIC PANEL
Calcium: 9.2
Creatinine, Ser: 0.73
GFR calc Af Amer: >60
GFR calc non Af Amer: >60
Sodium: 139

## 2010-12-07 LAB — URINALYSIS, ROUTINE W REFLEX MICROSCOPIC
Glucose, UA: 100 — AB
Hgb urine dipstick: NEGATIVE
Specific Gravity, Urine: 1.046 — ABNORMAL HIGH
Urobilinogen, UA: 0.2

## 2010-12-07 LAB — HEMOGLOBIN A1C: Mean Plasma Glucose: 282

## 2010-12-07 LAB — URINE CULTURE
Colony Count: 15000
Special Requests: NEGATIVE

## 2010-12-07 LAB — PROTIME-INR
INR: 0.9
Prothrombin Time: 12.8

## 2010-12-09 LAB — DIFFERENTIAL
Basophils Absolute: 0.1
Eosinophils Relative: 2
Lymphocytes Relative: 39
Lymphs Abs: 3.4
Monocytes Absolute: 0.5
Monocytes Relative: 6
Neutro Abs: 4.5

## 2010-12-09 LAB — BASIC METABOLIC PANEL
Calcium: 9.7
GFR calc Af Amer: >60
GFR calc non Af Amer: >60
Glucose, Bld: 193 — ABNORMAL HIGH
Potassium: 4.2
Sodium: 139

## 2010-12-09 LAB — CBC
HCT: 43
Hemoglobin: 14.3
RBC: 4.94
RDW: 13.7
WBC: 8.6

## 2010-12-09 LAB — APTT: aPTT: 24

## 2010-12-24 LAB — COMPREHENSIVE METABOLIC PANEL
Albumin: 4.1
Alkaline Phosphatase: 42
BUN: 21
CO2: 27
Chloride: 105
Creatinine, Ser: 0.92
GFR calc non Af Amer: >60
Potassium: 3.7
Total Bilirubin: 0.7

## 2010-12-24 LAB — CBC
HCT: 39.5
Hemoglobin: 13.5
MCV: 88.9
Platelets: 222
RBC: 4.44
WBC: 10.1

## 2012-07-16 ENCOUNTER — Inpatient Hospital Stay: Payer: Self-pay | Admitting: Internal Medicine

## 2012-07-16 LAB — COMPREHENSIVE METABOLIC PANEL
Albumin: 2.5 g/dL — ABNORMAL LOW (ref 3.4–5.0)
Anion Gap: 8 (ref 7–16)
BUN: 22 mg/dL — ABNORMAL HIGH (ref 7–18)
Calcium, Total: 6.7 mg/dL — CL (ref 8.5–10.1)
Chloride: 113 mmol/L — ABNORMAL HIGH (ref 98–107)
Co2: 21 mmol/L (ref 21–32)
Creatinine: 1.11 mg/dL (ref 0.60–1.30)
Osmolality: 290 (ref 275–301)
SGPT (ALT): 14 U/L (ref 12–78)
Total Protein: 5.4 g/dL — ABNORMAL LOW (ref 6.4–8.2)

## 2012-07-16 LAB — URINALYSIS, COMPLETE
Bilirubin,UR: NEGATIVE
Glucose,UR: 50 mg/dL (ref 0–75)
Granular Cast: 4
Leukocyte Esterase: NEGATIVE
Nitrite: NEGATIVE
Protein: >=500
Squamous Epithelial: <1
WBC UR: <1 /HPF (ref 0–5)

## 2012-07-16 LAB — CBC
HCT: 41 % (ref 40.0–52.0)
MCHC: 34.2 g/dL (ref 32.0–36.0)
MCV: 88 fL (ref 80–100)

## 2012-07-17 LAB — CBC WITH DIFFERENTIAL/PLATELET
Basophil %: 0.3 %
Eosinophil #: 0 10*3/uL (ref 0.0–0.7)
HCT: 33.7 % — ABNORMAL LOW (ref 40.0–52.0)
HGB: 11.4 g/dL — ABNORMAL LOW (ref 13.0–18.0)
Lymphocyte #: 0.7 10*3/uL — ABNORMAL LOW (ref 1.0–3.6)
Lymphocyte %: 5.3 %
MCH: 29.7 pg (ref 26.0–34.0)
Monocyte #: 1 x10 3/mm (ref 0.2–1.0)
Monocyte %: 7.5 %
Neutrophil %: 86.8 %
RBC: 3.86 10*6/uL — ABNORMAL LOW (ref 4.40–5.90)
RDW: 14.2 % (ref 11.5–14.5)
WBC: 13.5 10*3/uL — ABNORMAL HIGH (ref 3.8–10.6)

## 2012-07-17 LAB — BASIC METABOLIC PANEL
Anion Gap: 6 — ABNORMAL LOW (ref 7–16)
BUN: 27 mg/dL — ABNORMAL HIGH (ref 7–18)
Chloride: 104 mmol/L (ref 98–107)
Creatinine: 1.68 mg/dL — ABNORMAL HIGH (ref 0.60–1.30)
EGFR (Non-African Amer.): 41 — ABNORMAL LOW
Glucose: 154 mg/dL — ABNORMAL HIGH (ref 65–99)
Osmolality: 278 (ref 275–301)

## 2012-07-17 LAB — URINE CULTURE

## 2012-07-17 LAB — CLOSTRIDIUM DIFFICILE BY PCR

## 2012-07-18 LAB — CBC WITH DIFFERENTIAL/PLATELET
Basophil #: 0.1 10*3/uL (ref 0.0–0.1)
Eosinophil #: 0.1 10*3/uL (ref 0.0–0.7)
HCT: 35.6 % — ABNORMAL LOW (ref 40.0–52.0)
HGB: 12 g/dL — ABNORMAL LOW (ref 13.0–18.0)
Lymphocyte %: 12.8 %
MCV: 87 fL (ref 80–100)
Monocyte #: 0.9 x10 3/mm (ref 0.2–1.0)
Monocyte %: 10.5 %
Neutrophil %: 74.9 %
Platelet: 150 10*3/uL (ref 150–440)
RBC: 4.09 10*6/uL — ABNORMAL LOW (ref 4.40–5.90)
RDW: 14.3 % (ref 11.5–14.5)
WBC: 8.2 10*3/uL (ref 3.8–10.6)

## 2012-07-18 LAB — BASIC METABOLIC PANEL
Calcium, Total: 8.1 mg/dL — ABNORMAL LOW (ref 8.5–10.1)
Chloride: 104 mmol/L (ref 98–107)
Co2: 26 mmol/L (ref 21–32)
Creatinine: 1.59 mg/dL — ABNORMAL HIGH (ref 0.60–1.30)
Glucose: 165 mg/dL — ABNORMAL HIGH (ref 65–99)
Osmolality: 283 (ref 275–301)
Potassium: 3.8 mmol/L (ref 3.5–5.1)

## 2012-07-19 LAB — BASIC METABOLIC PANEL
Anion Gap: 8 (ref 7–16)
Calcium, Total: 8.1 mg/dL — ABNORMAL LOW (ref 8.5–10.1)
Co2: 27 mmol/L (ref 21–32)
EGFR (African American): >60
EGFR (Non-African Amer.): 58 — ABNORMAL LOW
Glucose: 150 mg/dL — ABNORMAL HIGH (ref 65–99)
Osmolality: 288 (ref 275–301)
Potassium: 3.3 mmol/L — ABNORMAL LOW (ref 3.5–5.1)
Sodium: 141 mmol/L (ref 136–145)

## 2012-07-21 LAB — CULTURE, BLOOD (SINGLE)

## 2012-07-22 LAB — WOUND CULTURE

## 2013-03-04 ENCOUNTER — Ambulatory Visit: Payer: Self-pay | Admitting: Ophthalmology

## 2013-03-04 DIAGNOSIS — I10 Essential (primary) hypertension: Secondary | ICD-10-CM

## 2013-03-19 ENCOUNTER — Ambulatory Visit: Payer: Self-pay | Admitting: Ophthalmology

## 2014-07-04 NOTE — Consult Note (Signed)
PATIENT NAME:  Jesus Alvarado, Jesus Alvarado MR#:  [id number] DATE OF BIRTH:  02/22/1941  DATE OF CONSULTATION:  07/17/2012  REFERRING PHYSICIAN:   CONSULTING PHYSICIAN:  Linus Galasodd Alivea Gladson, DPM  REASON FOR CONSULTATION: Podiatric evaluation of foot ulcer.  HISTORY OF PRESENT ILLNESS:  This is a 74 year old male with diabetes and significant neuropathy who was recently admitted for a 2-day history of fevers as well as weakness and malaise. He was admitted to the hospital and an ulcer was found on his left foot with some cellulitis. He states he has had this ulcer for the past 15 years and it flares up on and off. Admitted for evaluation and debridement.   PAST MEDICAL HISTORY: 1.  Diabetes mellitus.  2.  Neuropathy.  3.  Hypertension.  4.  Glaucoma.  5.  Hyperlipidemia.  6.  Acid reflux.   PAST SURGICAL HISTORY:  1.  Appendectomy.  2.  Cervical fusion x2.  3.  Cataract extraction.   MEDICATIONS:  Vitamin D3 1000 international units capsules daily.  Tamsulosin 0.4 mg daily. Simvastatin 20 mg half tablet daily. Senna 8.6 mg daily. Omeprazole 20 mg daily.  Multivitamin 1 tablet daily,.  Milk of magnesia p.r.n.  Metformin 1000 mg 1-1/2 tablet in the morning and one at night. Lisinopril 20 mg daily.  Hydrochlorothiazide 12.5 mg daily.  Glipizide 2.5 mg b.i.d. Gabapentin 300 mg b.i.d. Fish oil 1000 mg 1 tablet b.i.d.  Coreg6.25 mg b.i.d. Carafate 1 gram 0twice a day.  Bacitracin applied to infected area of the left foot.  Aspirin 325 mg daily.  Vitamin C 500 mg twice a day.   ALLERGIES:  No known allergies.   SOCIAL HISTORY: Denies any tobacco or alcohol. He is disabled.   FAMILY HISTORY: Unknown.   REVIEW OF SYSTEMS: Constitutional admitted with fever and weakness and fatigue.  EYES:  Significant loss of site due to glaucoma.  ENT no hearing loss, ear pain or allergies.  RESPIRATORY:  No cough, shortness of breath or wheezing.  CARDIOVASCULAR:  No chest pain, palpitations, arrhythmias.  GASTROINTESTINAL:   Denies any nausea or vomiting. No abdominal pain or heartburn.  GENITOURINARY:  No dysuria or incontinence. ENDOCRINE:  No polyuria or polydipsia. SKIN:  Does have some redness in his left forefoot. He states he ulcer has been draining a little bit. Chronic ulcer for the last 15 years on the bottom of his left foot.  MUSCULOSKELETAL: Generalized weakness. Hand contractures due to exposure to chemicals in the [employer].  NEUROLOGIC:  No history of CVA, TIA or seizures.   PHYSICAL EXAMINATION: VASCULAR: DP and PT pulses are palpable but PT pulses diminished. Capillary filling time is intact.  NEUROLOGICAL: There is loss of protective threshold with monofilament wire bilateral. Proprioception is impaired.  INTEGUMENT: Skin is warm, dry and somewhat atrophic. There is some erythema and edema at the left great toe joint she is localized to the area of ulceration plantarly. Upon debridement of the ulceration beneath the first metatarsal, it does extend full thickness and measures approximately 2 cm x 1 cm.  No clear tunneling through the deep tissues to the level of bone or joint.  Granular base upon debridement.  MUSCULOSKELETAL: Stiff range of motion of the pedal joints. Muscle testing is deferred. Some contracture of the digits is noted.   IMAGING DATA:  MRI performed yesterday reveal no clear evidence of any bone edema or suggested sign of osteomyelitis. There was a soft tissue ulceration noted on the MRI.   IMPRESSION: 1.  Diabetes with  neuropathy.  2.  Full-thickness ulceration left forefoot, CHRONIC with mild cellulitis and abscess.   PLAN: I debrided the ulceration, full thickness using tissue nippers and 15 blade. A culture was taken from the abscess for sensitivities. Wet-to-dry dressing applied to the foot and we will have this performed daily. At this point would not think that his foot will need any further deep debridement OR bone removal. We will follow him outpatient after his discharge.      ____________________________ Linus Galas, DPM tc:ct D: 07/17/2012 08:15:01 ET T: 07/17/2012 08:37:03 ET JOB#: [id number]  cc: Linus Galas, DPM, <Dictator> Dejour Vos DPM ELECTRONICALLY SIGNED 07/19/2012 8:23

## 2014-07-04 NOTE — H&P (Signed)
PATIENT NAME:  Jesus Alvarado, Jesus Alvarado MR#:  [id number] DATE OF BIRTH:  [birth year]  DATE OF ADMISSION:  07/16/2012  PRIMARY CARE PHYSICIAN: [facility].  CHIEF COMPLAINT:  Fevers and fall.   HISTORY OF PRESENT ILLNESS:  This is a 74 year old male with history of hypertension and diabetes who says that over the past 2 days he has had high fevers and just not feeling well associated with weakness.  This morning he slipped out of his bed, fell into the laundry basket, was so weak that he was unable to get up. He has a CNA that comes in and she came in about 8:00 this morning, about an hour after all this happened and EMS was then called. He is also complaining of abdominal pain, which he says is chronic, and he is going to see Dr. Vira Agar on the 22nd.  He has a left foot heel ulcer, which he says that he has seen a podiatrist at [facility] for, but it is not known of any infection.   REVIEW OF SYSTEMS: CONSTITUTIONAL: Positive fever, fatigue, weakness.  EYES: No blurred or double vision. He does have glaucoma.  ENT: No ear pain, hearing loss, seasonal allergies. He does snore.  RESPIRATORY: Denies any cough, wheezing, hemoptysis, COPD, dyspnea.  CARDIOVASCULAR: Denies any chest pain, palpitations, orthopnea, syncope, arrhythmia, or dyspnea on exertion.  GASTROINTESTINAL: No nausea, vomiting, diarrhea. He has had chronic abdominal pain, which is just not specific.  No melena, hematochezia, or rectal bleeding. No change in bowel habits.  GENITOURINARY: No dysuria or hematuria.  ENDOCRINE: No polyuria or polydipsia.  HEME AND LYMPH:  He does bruise easily. No anemia or swollen glands.  SKIN: No rashes. He does have ulcer at the base of his left foot. MUSCULOSKELETAL: He has generalized weakness, limited activity due to his body habitus.  NEUROLOGIC: No history of CVA, TIA or seizures. PSYCH:  No history of anxiety or depression.   PAST MEDICAL HISTORY:  1.  Diabetes.  2.  Hypertension.  3.   Glaucoma.   PAST SURGICAL HISTORY: 1.  Appendectomy.  2.  Two neck surgeries with cervical fusion.  3.  Cataract extraction.   MEDICATIONS:  1.  Vitamin D3 1000 international units 5 capsules daily.  2.  Tamsulosin 0.4 mg daily.  3.  Simvastatin 20 mg half tablet daily.  4.  Senna 8.6 mg daily.  5.  Omeprazole 20 mg daily.  6.  Multivitamin 1 tablet daily.  7.  Milk of magnesia as needed.  8.  Metformin 1000 mg 1-1/2 tablets in the morning and 1 tablet at night.  9.  Lisinopril 20 mg daily.  10.  HCTZ 12.5 mg daily.  11.  Glipizide 2.5 mg b.i.d.  12.  Gabapentin 300 mg b.i.d.  13.  Fish oil 1000 mg 1 tablet b.i.d.  14.  Coreg 6.25 b.i.d.  15.  Carafate 1 gram b.i.d.  16.  Bacitracin, apply to affected area.  17.  Aspirin 325 mg daily.  18.  Vitamin C 500 mg b.i.d.   SOCIAL HISTORY: No tobacco, alcohol or drug use.   FAMILY HISTORY: He does not known his mom or his family history. He says they have passed away.  PHYSICAL EXAMINATION: VITAL SIGNS: Temperature 102.5, pulse 111, respirations 20, blood pressure 168/80, 95% on room air.  GENERAL: The patient is alert and oriented, does not appear to be in acute distress.  HEENT: Head is atraumatic. Pupils are round. Anicteric sclerae.  Mucous membranes are dry. Oropharynx  is clear.  NECK: Supple without JVD.  No palpable enlarged thyroid.  HEART:  Tachycardia without murmur, gallops or rubs. PMI is hard to appreciate due to body habitus. LUNGS: Clear to auscultation without rhonchi, rales or wheezing.  BACK: No CVA or vertebral tenderness.  ABDOMEN: Obese. Bowel sounds are positive. No tenderness or rebound.  EXTREMITIES: No edema.  SKIN: He has a left foot ulcer at the base of his foot, about 2 cm x 2 cm, kind of necrotic in nature. NEUROLOGIC: Cranial nerves II through XII intact. No focal deficits.   LABORATORY AND DIAGNOSTICS: Urinalysis shows no LCE or nitrites.  Sodium 142, potassium 2.6, chloride 113, bicarbonate 21, BUN  22, creatinine 1.11 and glucose 165. Calcium 6.7, bilirubin 0.5, alk phos 40, ALT 14, AST 18, total protein 5.4 and albumin is 2.5. Troponin 0.03. Lactic acid 1.20. PCO2 is 26. White blood cells 11.4, hemoglobin 14, hematocrit 41 and platelets 175.   Chest x-ray shows no acute cardiopulmonary disease.   ASSESSMENT AND PLAN: This is a 74 year old male with a history of hypertension and diabetes who presents with fevers.  1.  Systemic antiinflammatory response syndrome with fevers, tachycardia and leukocytosis. Most likely the systemic antiinflammatory response syndrome is due to this foot infection.  His chest x-ray shows no cardiopulmonary disease. His urinalysis shows no sort of infection. I suspect it is from this left foot ulcer.  2.  Left foot ulcer. I will rule out osteomyelitis with a MRI. Will also obtain a podiatry consult. I have placed him on Zosyn and vancomycin empirically. Wound care per podiatry.  3.  Fevers.  Suspected due to problem #2. Will monitor carefully, provide Tylenol p.r.n. if needed. Blood cultures have been ordered and, as mentioned, UA and chest x-ray do not appear to have any sort of infection.  4.  Accelerated hypertension. I will increase the patient's Coreg and HCTZ, monitor carefully.  5.  Hypokalemia. Will replace the potassium and check a magnesium.   6.  Hypocalcemia.  It does not quite normalize with a low albumin. So, therefore, I will go ahead and check an ionized calcium.  7.  Diabetes. We will provide sliding scale insulin, ADA diet, and continue glipizide, holding metformin.  8.  Chronic abdominal pain. Will continue PPI and Carafate. The patient does have a follow-up with Dr. Vira Agar for an upper GI and lower GI on the 22nd of May.   CODE STATUS: FULL CODE.   TIME SPENT: Approximately 45 minutes.  ____________________________ Donell Beers. Benjie Karvonen, MD spm:sb D: 07/16/2012 12:59:36 ET T: 07/16/2012 13:23:48 ET JOB#: [id number]  cc: Addelyn Alleman P. Benjie Karvonen, MD,  <Dictator> Manya Silvas, MD [facility] Venia Carbon, MD Donell Beers Siri Buege MD ELECTRONICALLY SIGNED 07/16/2012 15:24

## 2014-07-04 NOTE — Discharge Summary (Signed)
PATIENT NAME:  MARSELINO, Jesus Alvarado MR#:  [id number] DATE OF BIRTH:  October 04, 1940  DATE OF ADMISSION:  07/16/2012 DATE OF DISCHARGE:  07/19/2012   ADMITTING DIAGNOSIS: Systemic inflammatory response reaction.   DISCHARGE DIAGNOSES:  1.  Systemic inflammatory response reaction.  2.  Left foot diabetic ulcer with Pseudomonas as well as Serratia infection, status post debridement on 07/17/2012 by Dr. Alberteen Spindle.  3.  Hypokalemia.  4.  Hypomagnesemia. 5.  Dehydration.  6.  Acute renal failure. 7.  Diarrhea, likely viral, resolving. 8.  History of hypertension, hyperlipidemia, diabetes mellitus, as well as glaucoma.   DISCHARGE CONDITION: Stable.   DISCHARGE MEDICATIONS: The patient is to resume his outpatient medications which are:  1.  Tamsulosin 0.4 mg p.o. daily. 2.  Vitamin D3, 1000 international units 5 capsules once daily. 3.  Aspirin 325 mg p.o. once daily. 4.  Ascorbic acid 500 mg p.o. twice daily. 5.  Carafate 1 gram twice daily. 6.  Simvastatin 20 mg p.o. 1/2 tablet once daily at bedtime.  7.  Omeprazole 20 mg p.o. once daily.  8.  Milk of magnesia 8% solution 15 mL once daily as needed.  9.  Multivitamins with minerals once daily.  10.  Fish oil 1 gram twice daily.  11.  Gabapentin 300 mg p.o. twice daily.  12.  Glipizide 2.5 mg p.o. twice daily.  13.  Lisinopril 20 mg p.o. once daily.  14.  Metformin 1 gram tablets, 1-1/2 tablets in the morning and 1 tablet at night.  15.  HCTZ 12.5 mg p.o. once daily.  16.  Senna 8.6 mg p.o. 1 daily as needed at bedtime.  17.  Acetaminophen/hydrocodone 325/5, 1 tablet every 4 hours as needed.  18.  Loperamide 2 mg p.o. 4 times daily as needed. 19.  Carvedilol 12.5 mg p.o. twice daily. This is a new dose.  20.  Simethicone 80 mg p.o. 4 times daily before meals and at bedtime.  21.  Tetrahydrozoline 0.05% ophthalmic solution 1 drop to each affected eye every 4 hours as needed. 22.  Ciprofloxacin 500 mg p.o. twice daily for 12 more days.    DISCHARGE INSTRUCTIONS: The patient is to continue left foot dressings daily with wet-to-dry dressings. He refused home health.   DIET: 2 gram salt, low-fat, low-cholesterol, carbohydrate-controlled diet.   ACTIVITY LIMITATIONS: As tolerated.   REFERRAL: To home health physical therapy as well as RN if the patient is agreeable.   FOLLOWUP: Followup appointment at [facility] in 2 days after discharge. Followup appointment with Dr. Alberteen Spindle in 4 to 6 weeks after discharge.   CONSULTANTS: Dr. Alberteen Spindle.  PROCEDURES: Bedside debridement of left foot ulcer 07/17/2012 by Dr. Alberteen Spindle.   RADIOLOGIC STUDIES: MRI of left foot with and without contrast on 07/16/2012 showed ulceration with some soft tissue edema without evidence of osteomyelitis. Chest portable single view x-ray on 07/16/2012 showed no acute abnormality, postsurgical changes.   HISTORY OF PRESENT ILLNESS AND HOSPITAL COURSE: The patient is a 74 year old Caucasian male with history of hypertension, hyperlipidemia, diabetes mellitus, who presented to the hospital with complaints of high fevers as well as not feeling well and having falls. Please refer to Dr. Camillia Herter admission note on 07/16/2012. On arrival to the Emergency Room, the patient's temperature was 102.5, pulse 111. Respiratory rate was 20, blood pressure 168/80. Saturation was 95% on room air. Physical exam revealed left foot base of the foot ulcer, approximately 2 cm in diameter with necrotic slough at the base. The patient's lab data done  on the day of admission, 07/16/2012, revealed mild elevation of BUN to 22 and glucose to 165. Potassium level was 2.6, magnesium level 0.7. Otherwise, BMP was unremarkable. However, the patient's calcium level was low at 6.7. The patient's total protein was noted to be 5.4, albumin level of 2.5. Otherwise, liver enzymes were normal. Troponin was 0.03. White blood cell count was elevated to 11.4, hemoglobin 14.0, platelet count 175. Blood cultures taken on  07/16/2012 showed no growth x 2. Sputum culture also showed no growth. Urinalysis was remarkable for 5 red blood cells, less than 1 white blood cell, trace bacteria. The patient's pH was checked, was found to be slightly elevated at 7.43; pCO2 was 26; pO2 was not checked. However, lactic acid level was normal at 1.2.   The patient was admitted to the hospital for further evaluation. He was started on IV fluids, as well as potassium level as well as magnesium levels were supplemented. He was also initiated on broad-spectrum antibiotics. He was evaluated by Dr. Alberteen Spindleline, who felt the patient would benefit from bedside debridement of this left foot ulcer. The patient had debridement of ulceration full-thickness using tissue nippers and #15 blade. A culture was taken from the abscess for sensitivities. Wet-to-dry dressing was applied to the foot and recommended to be performed daily. Dr. Alberteen Spindleline did not feel that the patient's foot will need any further debridement or bone removal. He recommended to follow up with podiatrist as outpatient upon discharge. Post procedure, the patient did well. His wound cultures came back positive for Serratia and Pseudomonas. He is being initiated on antibiotic, ciprofloxacin, orally today on the day of discharge, 07/19/2012. With antibiotic therapy, the patient's white blood cell count normalized already by 07/18/2012. The patient was recommended to continue antibiotics for 12 more days to complete a 14-day course. As mentioned above, the patient's systemic inflammatory response reaction was very likely related to infected diabetic ulcer. The patient was noted to be hypokalemic as well as hypomagnesemic and dehydrated. He was rehydrated and his magnesium as well as potassium levels were supplemented and normalized.   The patient was noted to be in acute renal failure. The patient's kidney function was abnormal with creatinine level being 1.68 on 07/17/2012. With IV fluid administration,  the patient's kidney function improved. On day of discharge, the patient's BUN and creatinine were 23 and 1.26. It is recommended to follow the patient's BUN and creatinine as outpatient and make decisions about holding HCTZ or ACE inhibitor if needed.   The patient was complaining also of diarrhea. The patient's stool cultures were taken and those were negative for bacterial pathogens or C. difficile. It was felt that the patient's diarrhea could have been related to viral process and it was resolving.   For hypertension, hyperlipidemia, diabetes mellitus, glaucoma, the patient is to continue his outpatient management. Initially when the patient's kidney function was abnormal, metformin was placed on hold. Now since his kidney function is back to normal, it is recommended to resume his metformin dosing. It is recommended, however, to check his creatinine levels as outpatient and make decisions about discontinuation of metformin if his kidney function suffers due to diabetes.   On  day of discharge, he felt satisfactory, did not complain of any significant discomfort. He is being discharged in stable condition with the above-mentioned medications and followup. His vital signs on the day of discharge: Temperature 98.4, pulse 78, respiratory rate 18, blood pressure 160/80, saturation 98% on room air at rest.  TIME SPENT: 40 minutes.   ____________________________ Katharina Caper, MD rv:jm D: 07/19/2012 14:11:03 ET T: 07/19/2012 14:58:42 ET JOB#: [id number]  cc: Katharina Caper, MD, <Dictator> Linus Galas, DPM Demarion Pondexter MD ELECTRONICALLY SIGNED 08/02/2012 10:03

## 2014-07-05 NOTE — Op Note (Signed)
PATIENT NAME:  Jesus Alvarado, [name] MR#:  [id number] DATE OF BIRTH:  November 15, 1940  DATE OF PROCEDURE:  03/19/2013  PREOPERATIVE DIAGNOSIS: Visually significant cataract of the right eye.   POSTOPERATIVE DIAGNOSIS: Visually significant cataract of the right eye.   OPERATIVE PROCEDURE: Cataract extraction by phacoemulsification with implant of intraocular lens to the right eye.   SURGEON: Galen ManilaWilliam Jermond Burkemper, MD  ANESTHESIA:  1. Managed anesthesia care.  2. 50-50 mixture of 0.75% bupivacaine and 4% Xylocaine given as a retrobulbar block.   COMPLICATIONS: None.   TECHNIQUE:  Stop and chop.   DESCRIPTION OF PROCEDURE: The patient was examined and consented for this procedure in the preoperative holding area and then brought back to the Operating Room where the anesthesia team employed managed anesthesia care.  3.5 milliliters of the aforementioned mixture were placed in the right orbit on an Atkinson needle without complication. The right eye was then prepped and draped in the usual sterile ophthalmic fashion. A lid speculum was placed. The side-port blade was used to create a paracentesis and the anterior chamber was filled with viscoelastic. The keratome was used to create a near clear corneal incision. The continuous curvilinear capsulorrhexis was performed with a cystotome followed by the capsulorrhexis forceps. Hydrodissection and hydrodelineation were carried out with BSS on a blunt cannula. The lens was removed in a stop and chop technique. The remaining cortical material was removed with the irrigation-aspiration handpiece. The capsular bag was inflated with viscoelastic and the Tecnis ZCB00 20.0-diopter lens, serial number [id number] was placed in the capsular bag without complication. The remaining viscoelastic was removed from the eye with the irrigation-aspiration handpiece. The wounds were hydrated. The anterior chamber was flushed with Miostat and the eye was inflated to a physiologic pressure. 0.1  mL of cefuroxime concentration 10 mg/mL was placed in the anterior chamber. The wounds were found to be water tight. The eye was dressed with Vigamox followed by Maxitrol ointment and a protective shield was placed. The patient will followup with me in one day.   Please note that a Malyugin ring was placed following creation of the main clear corneal incision due to miosis despite exceptional effort to get pharmacologic mydrias prior to surgery. Also note, that a suture was placed through the main incision due to a floppy iris.    ____________________________ Jerilee FieldWilliam L. Madeline Pho, MD wlp:ms D: 03/19/2013 21:04:11 ET T: 03/19/2013 21:43:43 ET JOB#: [id number]  cc: Kateline Kinkade L. Daron Breeding, MD, <Dictator> Jerilee FieldWILLIAM L Barth Trella MD ELECTRONICALLY SIGNED 03/21/2013 17:20

## 2014-08-28 ENCOUNTER — Emergency Department: Payer: Medicare Other

## 2014-08-28 ENCOUNTER — Encounter: Payer: Self-pay | Admitting: Emergency Medicine

## 2014-08-28 ENCOUNTER — Emergency Department
Admission: EM | Admit: 2014-08-28 | Discharge: 2014-08-28 | Disposition: A | Discharge status: Home / Self Care | Payer: Medicare Other | Attending: Emergency Medicine | Admitting: Emergency Medicine

## 2014-08-28 DIAGNOSIS — R0602 Shortness of breath: Secondary | ICD-10-CM | POA: Diagnosis not present

## 2014-08-28 DIAGNOSIS — R600 Localized edema: Secondary | ICD-10-CM | POA: Insufficient documentation

## 2014-08-28 DIAGNOSIS — E1140 Type 2 diabetes mellitus with diabetic neuropathy, unspecified: Secondary | ICD-10-CM | POA: Insufficient documentation

## 2014-08-28 DIAGNOSIS — L97529 Non-pressure chronic ulcer of other part of left foot with unspecified severity: Secondary | ICD-10-CM | POA: Insufficient documentation

## 2014-08-28 DIAGNOSIS — I10 Essential (primary) hypertension: Secondary | ICD-10-CM | POA: Insufficient documentation

## 2014-08-28 HISTORY — DX: Other intervertebral disc degeneration, lumbar region without mention of lumbar back pain or lower extremity pain: M51.369

## 2014-08-28 HISTORY — DX: Polyneuropathy, unspecified: G62.9

## 2014-08-28 HISTORY — DX: Type 2 diabetes mellitus without complications: E11.9

## 2014-08-28 HISTORY — DX: Other intervertebral disc degeneration, lumbar region: M51.36

## 2014-08-28 LAB — CBC WITH DIFFERENTIAL/PLATELET
Basophils Absolute: 0.1 10*3/uL (ref 0–0.1)
Basophils Relative: 1 %
EOS ABS: 0.2 10*3/uL (ref 0–0.7)
EOS PCT: 2 %
HCT: 39.2 % — ABNORMAL LOW (ref 40.0–52.0)
Hemoglobin: 13.3 g/dL (ref 13.0–18.0)
LYMPHS ABS: 1.5 10*3/uL (ref 1.0–3.6)
Lymphocytes Relative: 19 %
MCH: 30.8 pg (ref 26.0–34.0)
MCHC: 33.9 g/dL (ref 32.0–36.0)
MCV: 90.8 fL (ref 80.0–100.0)
MONOS PCT: 6 %
Monocytes Absolute: 0.5 10*3/uL (ref 0.2–1.0)
Neutro Abs: 5.6 10*3/uL (ref 1.4–6.5)
Neutrophils Relative %: 72 %
Platelets: 200 10*3/uL (ref 150–440)
RBC: 4.32 MIL/uL — ABNORMAL LOW (ref 4.40–5.90)
RDW: 14.4 % (ref 11.5–14.5)
WBC: 7.9 10*3/uL (ref 3.8–10.6)

## 2014-08-28 LAB — BASIC METABOLIC PANEL
ANION GAP: 8 (ref 5–15)
BUN: 39 mg/dL — ABNORMAL HIGH (ref 6–20)
CO2: 26 mmol/L (ref 22–32)
Calcium: 8.5 mg/dL — ABNORMAL LOW (ref 8.9–10.3)
Chloride: 104 mmol/L (ref 101–111)
Creatinine, Ser: 1.82 mg/dL — ABNORMAL HIGH (ref 0.61–1.24)
GFR calc Af Amer: 41 mL/min — ABNORMAL LOW (ref >60)
GFR, EST NON AFRICAN AMERICAN: 36 mL/min — AB (ref >60)
Glucose, Bld: 229 mg/dL — ABNORMAL HIGH (ref 65–99)
Potassium: 4.1 mmol/L (ref 3.5–5.1)
Sodium: 138 mmol/L (ref 135–145)

## 2014-08-28 LAB — TROPONIN I: Troponin I: <0.03 ng/mL (ref <0.031)
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
## 2014-08-28 NOTE — ED Notes (Signed)
States see podiatry at [facility] for wound on left heel. States the calf swelling for 8 months (pt corrected and states 8 days)  with no pain just stiffness. Saw new doctor today for his calf and was sent over for possible dvt. Also has fungal infection that va sees him for on rt leg.

## 2014-08-28 NOTE — Discharge Instructions (Signed)
The ultrasound does not see a blood clot in your left leg. This swelling has been going on for some time. Follow-up with your regular doctor for further evaluation and ongoing care.  Edema Edema is an abnormal buildup of fluids. It is more common in your legs and thighs. Painless swelling of the feet and ankles is more likely as a person ages. It also is common in looser skin, like around your eyes. HOME CARE   Keep the affected body part above the level of the heart while lying down.  Do not sit still or stand for a long time.  Do not put anything right under your knees when you lie down.  Do not wear tight clothes on your upper legs.  Exercise your legs to help the puffiness (swelling) go down.  Wear elastic bandages or support stockings as told by your doctor.  A low-salt diet may help lessen the puffiness.  Only take medicine as told by your doctor. GET HELP IF:  Treatment is not working.  You have heart, liver, or kidney disease and notice that your skin looks puffy or shiny.  You have puffiness in your legs that does not get better when you raise your legs.  You have sudden weight gain for no reason. GET HELP RIGHT AWAY IF:   You have shortness of breath or chest pain.  You cannot breathe when you lie down.  You have pain, redness, or warmth in the areas that are puffy.  You have heart, liver, or kidney disease and get edema all of a sudden.  You have a fever and your symptoms get worse all of a sudden. MAKE SURE YOU:   Understand these instructions.  Will watch your condition.  Will get help right away if you are not doing well or get worse. Document Released: 08/17/2007 Document Revised: 03/05/2013 Document Reviewed: 12/21/2012 [facility] Patient Information 2015 [place], [place]. This information is not intended to replace advice given to you by your health care provider. Make sure you discuss any questions you have with your health care provider.

## 2014-08-28 NOTE — ED Notes (Signed)
Pt reports that he has had left foot swelling for the last 8 months, but last week the swelling started going up his leg. He denies any SOB or travel. Left leg is slightly swollen. No pitting edema. No redness. Pt brought over from [facility].

## 2015-05-29 ENCOUNTER — Other Ambulatory Visit: Payer: Self-pay | Admitting: Ophthalmology

## 2015-05-29 ENCOUNTER — Ambulatory Visit
Admission: RE | Admit: 2015-05-29 | Discharge: 2015-05-29 | Disposition: A | Discharge status: Home / Self Care | Payer: Medicare Other | Source: Ambulatory Visit | Attending: Ophthalmology | Admitting: Ophthalmology

## 2015-05-29 DIAGNOSIS — I6523 Occlusion and stenosis of bilateral carotid arteries: Secondary | ICD-10-CM | POA: Insufficient documentation

## 2015-05-29 DIAGNOSIS — I6782 Cerebral ischemia: Secondary | ICD-10-CM | POA: Diagnosis not present

## 2015-05-29 DIAGNOSIS — R51 Headache: Secondary | ICD-10-CM | POA: Diagnosis present

## 2015-05-29 DIAGNOSIS — I6502 Occlusion and stenosis of left vertebral artery: Secondary | ICD-10-CM | POA: Diagnosis not present

## 2015-05-29 DIAGNOSIS — H4902 Third [oculomotor] nerve palsy, left eye: Secondary | ICD-10-CM

## 2015-05-29 HISTORY — DX: Essential (primary) hypertension: I10

## 2015-05-29 LAB — POCT I-STAT CREATININE: Creatinine, Ser: 2 mg/dL — ABNORMAL HIGH (ref 0.61–1.24)

## 2015-05-29 MED ORDER — IOHEXOL 350 MG/ML SOLN
65.0000 mL | Freq: Once | INTRAVENOUS | Status: AC | PRN
  Administered 2015-05-29: 65 mL via INTRAVENOUS

## 2015-05-29 MED ORDER — IOHEXOL 350 MG/ML SOLN: 100.0000 mL | Freq: Once | INTRAVENOUS | Status: DC | PRN

## 2015-06-17 ENCOUNTER — Encounter: Payer: Self-pay | Admitting: Emergency Medicine

## 2015-06-17 ENCOUNTER — Emergency Department
Admission: EM | Admit: 2015-06-17 | Discharge: 2015-06-17 | Disposition: A | Discharge status: Home / Self Care | Payer: Medicare Other | Attending: Emergency Medicine | Admitting: Emergency Medicine

## 2015-06-17 ENCOUNTER — Other Ambulatory Visit: Payer: Self-pay

## 2015-06-17 ENCOUNTER — Emergency Department: Payer: Medicare Other

## 2015-06-17 DIAGNOSIS — E119 Type 2 diabetes mellitus without complications: Secondary | ICD-10-CM | POA: Diagnosis not present

## 2015-06-17 DIAGNOSIS — R51 Headache: Secondary | ICD-10-CM | POA: Insufficient documentation

## 2015-06-17 DIAGNOSIS — H02432 Paralytic ptosis of left eyelid: Secondary | ICD-10-CM | POA: Insufficient documentation

## 2015-06-17 DIAGNOSIS — I10 Essential (primary) hypertension: Secondary | ICD-10-CM | POA: Diagnosis not present

## 2015-06-17 DIAGNOSIS — E785 Hyperlipidemia, unspecified: Secondary | ICD-10-CM | POA: Insufficient documentation

## 2015-06-17 DIAGNOSIS — R519 Headache, unspecified: Secondary | ICD-10-CM

## 2015-06-17 DIAGNOSIS — H02402 Unspecified ptosis of left eyelid: Secondary | ICD-10-CM

## 2015-06-17 DIAGNOSIS — H4902 Third [oculomotor] nerve palsy, left eye: Secondary | ICD-10-CM

## 2015-06-17 LAB — COMPREHENSIVE METABOLIC PANEL
ALBUMIN: 3.8 g/dL (ref 3.5–5.0)
ALK PHOS: 48 U/L (ref 38–126)
ALT: 16 U/L — ABNORMAL LOW (ref 17–63)
ANION GAP: 11 (ref 5–15)
AST: 17 U/L (ref 15–41)
BUN: 37 mg/dL — ABNORMAL HIGH (ref 6–20)
CALCIUM: 8.9 mg/dL (ref 8.9–10.3)
CHLORIDE: 103 mmol/L (ref 101–111)
CO2: 21 mmol/L — AB (ref 22–32)
Creatinine, Ser: 1.92 mg/dL — ABNORMAL HIGH (ref 0.61–1.24)
GFR calc non Af Amer: 33 mL/min — ABNORMAL LOW (ref >60)
GFR, EST AFRICAN AMERICAN: 39 mL/min — AB (ref >60)
GLUCOSE: 164 mg/dL — AB (ref 65–99)
Potassium: 3.9 mmol/L (ref 3.5–5.1)
SODIUM: 135 mmol/L (ref 135–145)
Total Bilirubin: 0.5 mg/dL (ref 0.3–1.2)
Total Protein: 7.3 g/dL (ref 6.5–8.1)

## 2015-06-17 LAB — CBC
HCT: 41.3 % (ref 40.0–52.0)
Hemoglobin: 13.9 g/dL (ref 13.0–18.0)
MCH: 29.7 pg (ref 26.0–34.0)
MCHC: 33.7 g/dL (ref 32.0–36.0)
MCV: 88.1 fL (ref 80.0–100.0)
PLATELETS: 201 10*3/uL (ref 150–440)
RBC: 4.69 MIL/uL (ref 4.40–5.90)
RDW: 14.7 % — AB (ref 11.5–14.5)
WBC: 9.9 10*3/uL (ref 3.8–10.6)

## 2015-06-17 LAB — DIFFERENTIAL
Basophils Absolute: 0.1 10*3/uL (ref 0–0.1)
Basophils Relative: 1 %
EOS PCT: 1 %
Eosinophils Absolute: 0.1 10*3/uL (ref 0–0.7)
LYMPHS PCT: 16 %
Lymphs Abs: 1.6 10*3/uL (ref 1.0–3.6)
MONO ABS: 0.7 10*3/uL (ref 0.2–1.0)
Monocytes Relative: 7 %
NEUTROS ABS: 7.4 10*3/uL — AB (ref 1.4–6.5)
NEUTROS PCT: 75 %

## 2015-06-17 LAB — TROPONIN I: Troponin I: <0.03 ng/mL (ref <0.031)

## 2015-06-17 LAB — PROTIME-INR
INR: 1.08
PROTHROMBIN TIME: 14.2 s (ref 11.4–15.0)

## 2015-06-17 LAB — APTT: aPTT: 24 seconds (ref 24–36)

## 2015-06-17 LAB — GLUCOSE, CAPILLARY: GLUCOSE-CAPILLARY: 116 mg/dL — AB (ref 65–99)
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
## 2015-06-17 NOTE — ED Notes (Signed)
POA and Goddaughter, Jesus Alvarado, is requesting to be called once a decision has been made in regards to admission or discharge. [phone].

## 2015-06-17 NOTE — ED Notes (Signed)
Patient transported to MRI 

## 2015-06-17 NOTE — ED Notes (Signed)
Attempted to call Abby, POA, no answer.

## 2015-11-09 ENCOUNTER — Emergency Department: Payer: Medicare Other

## 2015-11-09 ENCOUNTER — Emergency Department
Admission: EM | Admit: 2015-11-09 | Discharge: 2015-11-09 | Disposition: A | Discharge status: Home / Self Care | Payer: Medicare Other | Attending: Emergency Medicine | Admitting: Emergency Medicine

## 2015-11-09 ENCOUNTER — Encounter: Payer: Self-pay | Admitting: Emergency Medicine

## 2015-11-09 DIAGNOSIS — Z794 Long term (current) use of insulin: Secondary | ICD-10-CM | POA: Diagnosis not present

## 2015-11-09 DIAGNOSIS — R600 Localized edema: Secondary | ICD-10-CM | POA: Insufficient documentation

## 2015-11-09 DIAGNOSIS — J189 Pneumonia, unspecified organism: Secondary | ICD-10-CM | POA: Insufficient documentation

## 2015-11-09 DIAGNOSIS — Z79899 Other long term (current) drug therapy: Secondary | ICD-10-CM | POA: Diagnosis not present

## 2015-11-09 DIAGNOSIS — I10 Essential (primary) hypertension: Secondary | ICD-10-CM | POA: Insufficient documentation

## 2015-11-09 DIAGNOSIS — M7989 Other specified soft tissue disorders: Secondary | ICD-10-CM | POA: Diagnosis present

## 2015-11-09 DIAGNOSIS — Z7982 Long term (current) use of aspirin: Secondary | ICD-10-CM | POA: Insufficient documentation

## 2015-11-09 DIAGNOSIS — E119 Type 2 diabetes mellitus without complications: Secondary | ICD-10-CM | POA: Insufficient documentation

## 2015-11-09 DIAGNOSIS — R609 Edema, unspecified: Secondary | ICD-10-CM

## 2015-11-09 LAB — BASIC METABOLIC PANEL
ANION GAP: 8 (ref 5–15)
BUN: 43 mg/dL — AB (ref 6–20)
CHLORIDE: 105 mmol/L (ref 101–111)
CO2: 28 mmol/L (ref 22–32)
Calcium: 8.7 mg/dL — ABNORMAL LOW (ref 8.9–10.3)
Creatinine, Ser: 2.05 mg/dL — ABNORMAL HIGH (ref 0.61–1.24)
GFR, EST AFRICAN AMERICAN: 35 mL/min — AB (ref >60)
GFR, EST NON AFRICAN AMERICAN: 30 mL/min — AB (ref >60)
Glucose, Bld: 237 mg/dL — ABNORMAL HIGH (ref 65–99)
POTASSIUM: 3.9 mmol/L (ref 3.5–5.1)
SODIUM: 141 mmol/L (ref 135–145)

## 2015-11-09 LAB — CBC
HEMATOCRIT: 40.2 % (ref 40.0–52.0)
Hemoglobin: 13.4 g/dL (ref 13.0–18.0)
MCH: 30.4 pg (ref 26.0–34.0)
MCHC: 33.3 g/dL (ref 32.0–36.0)
MCV: 91.3 fL (ref 80.0–100.0)
Platelets: 209 10*3/uL (ref 150–440)
RBC: 4.4 MIL/uL (ref 4.40–5.90)
RDW: 15 % — AB (ref 11.5–14.5)
WBC: 8.3 10*3/uL (ref 3.8–10.6)

## 2015-11-09 LAB — TROPONIN I: Troponin I: <0.03 ng/mL (ref <0.03)

## 2015-11-09 MED ORDER — AZITHROMYCIN 250 MG PO TABS: 250.0000 mg | ORAL_TABLET | Freq: Every day | ORAL | 0 refills | Status: DC

## 2015-11-09 MED ORDER — AZITHROMYCIN 500 MG PO TABS
500.0000 mg | ORAL_TABLET | Freq: Once | ORAL | Status: AC
  Administered 2015-11-09: 500 mg via ORAL
  Filled 2015-11-09: qty 1

## 2015-11-09 MED ORDER — FUROSEMIDE 20 MG PO TABS: 20.0000 mg | ORAL_TABLET | Freq: Every day | ORAL | 0 refills | Status: DC

## 2015-11-09 MED ORDER — FUROSEMIDE 40 MG PO TABS
20.0000 mg | ORAL_TABLET | Freq: Once | ORAL | Status: AC
  Administered 2015-11-09: 20 mg via ORAL
  Filled 2015-11-09: qty 1

## 2015-11-09 NOTE — ED Triage Notes (Signed)
C/O elevated blood pressure, fluid to legs, SOB x 2 (when laying down and elevating feet)  weeks with SOB worsening over past two days.

## 2015-11-09 NOTE — Discharge Instructions (Signed)
Please take your antibiotics and fluid pill as prescribed. Please call the number provided for cardiology to arrange a follow-up appointment this week for echocardiogram/ultrasound. Please return to the emergency department for any increased swelling, any trouble breathing, chest pain, or any other symptom personally concerning to yourself. As we discussed please follow-up your primary care doctor to arrange a follow-up chest x-ray in 4 weeks to ensure resolution of your left lower lobe consolidation.

## 2015-11-09 NOTE — ED Notes (Signed)
Pt discharged home after verbalizing understanding of discharge instructions; nad noted. 

## 2015-11-09 NOTE — ED Notes (Addendum)
Pt comes in due to recommendation by NP. Pt has hx of hypertension and is experiencing hypertension, RLE and LLE edema. Upon assessment skin is tight and a quarter sized blister is noted on L.leg. Denies SOB currently but states it occurs "when elevating legs"  , denies chest pain. Pt AOx4.
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
## 2016-01-06 ENCOUNTER — Encounter: Payer: Self-pay | Admitting: *Deleted

## 2016-01-06 ENCOUNTER — Inpatient Hospital Stay
Admission: AD | Admit: 2016-01-06 | Discharge: 2016-01-10 | DRG: 628 | Disposition: A | Discharge status: Home Health | Payer: Non-veteran care | Source: Ambulatory Visit | Attending: Internal Medicine | Admitting: Internal Medicine

## 2016-01-06 ENCOUNTER — Inpatient Hospital Stay
Admission: AD | Admit: 2016-01-06 | Discharge: 2016-01-06 | Disposition: A | Discharge status: Home / Self Care | Payer: Non-veteran care | Source: Ambulatory Visit | Attending: Internal Medicine | Admitting: Internal Medicine

## 2016-01-06 DIAGNOSIS — M869 Osteomyelitis, unspecified: Secondary | ICD-10-CM | POA: Diagnosis present

## 2016-01-06 DIAGNOSIS — I5033 Acute on chronic diastolic (congestive) heart failure: Secondary | ICD-10-CM | POA: Diagnosis present

## 2016-01-06 DIAGNOSIS — L03116 Cellulitis of left lower limb: Secondary | ICD-10-CM | POA: Diagnosis present

## 2016-01-06 DIAGNOSIS — H9190 Unspecified hearing loss, unspecified ear: Secondary | ICD-10-CM | POA: Diagnosis present

## 2016-01-06 DIAGNOSIS — I10 Essential (primary) hypertension: Secondary | ICD-10-CM | POA: Diagnosis not present

## 2016-01-06 DIAGNOSIS — M86172 Other acute osteomyelitis, left ankle and foot: Secondary | ICD-10-CM | POA: Diagnosis not present

## 2016-01-06 DIAGNOSIS — I130 Hypertensive heart and chronic kidney disease with heart failure and stage 1 through stage 4 chronic kidney disease, or unspecified chronic kidney disease: Secondary | ICD-10-CM | POA: Diagnosis present

## 2016-01-06 DIAGNOSIS — L089 Local infection of the skin and subcutaneous tissue, unspecified: Secondary | ICD-10-CM | POA: Diagnosis present

## 2016-01-06 DIAGNOSIS — E1122 Type 2 diabetes mellitus with diabetic chronic kidney disease: Secondary | ICD-10-CM | POA: Diagnosis present

## 2016-01-06 DIAGNOSIS — E1169 Type 2 diabetes mellitus with other specified complication: Principal | ICD-10-CM | POA: Diagnosis present

## 2016-01-06 DIAGNOSIS — Z794 Long term (current) use of insulin: Secondary | ICD-10-CM | POA: Diagnosis not present

## 2016-01-06 DIAGNOSIS — N179 Acute kidney failure, unspecified: Secondary | ICD-10-CM | POA: Diagnosis present

## 2016-01-06 DIAGNOSIS — R0602 Shortness of breath: Secondary | ICD-10-CM

## 2016-01-06 DIAGNOSIS — Z6841 Body Mass Index (BMI) 40.0 and over, adult: Secondary | ICD-10-CM | POA: Diagnosis not present

## 2016-01-06 DIAGNOSIS — N184 Chronic kidney disease, stage 4 (severe): Secondary | ICD-10-CM | POA: Diagnosis present

## 2016-01-06 DIAGNOSIS — E1151 Type 2 diabetes mellitus with diabetic peripheral angiopathy without gangrene: Secondary | ICD-10-CM | POA: Diagnosis present

## 2016-01-06 DIAGNOSIS — E11621 Type 2 diabetes mellitus with foot ulcer: Secondary | ICD-10-CM | POA: Diagnosis present

## 2016-01-06 DIAGNOSIS — H409 Unspecified glaucoma: Secondary | ICD-10-CM | POA: Diagnosis present

## 2016-01-06 DIAGNOSIS — L97529 Non-pressure chronic ulcer of other part of left foot with unspecified severity: Secondary | ICD-10-CM | POA: Diagnosis present

## 2016-01-06 DIAGNOSIS — D869 Sarcoidosis, unspecified: Secondary | ICD-10-CM | POA: Diagnosis present

## 2016-01-06 DIAGNOSIS — R918 Other nonspecific abnormal finding of lung field: Secondary | ICD-10-CM | POA: Diagnosis present

## 2016-01-06 DIAGNOSIS — M7989 Other specified soft tissue disorders: Secondary | ICD-10-CM | POA: Diagnosis present

## 2016-01-06 DIAGNOSIS — I771 Stricture of artery: Secondary | ICD-10-CM | POA: Diagnosis present

## 2016-01-06 DIAGNOSIS — R600 Localized edema: Secondary | ICD-10-CM | POA: Diagnosis not present

## 2016-01-06 DIAGNOSIS — E1142 Type 2 diabetes mellitus with diabetic polyneuropathy: Secondary | ICD-10-CM | POA: Diagnosis present

## 2016-01-06 DIAGNOSIS — I70202 Unspecified atherosclerosis of native arteries of extremities, left leg: Secondary | ICD-10-CM | POA: Diagnosis present

## 2016-01-06 DIAGNOSIS — E785 Hyperlipidemia, unspecified: Secondary | ICD-10-CM | POA: Diagnosis present

## 2016-01-06 DIAGNOSIS — Z811 Family history of alcohol abuse and dependence: Secondary | ICD-10-CM

## 2016-01-06 DIAGNOSIS — L89609 Pressure ulcer of unspecified heel, unspecified stage: Secondary | ICD-10-CM | POA: Diagnosis present

## 2016-01-06 DIAGNOSIS — Z8711 Personal history of peptic ulcer disease: Secondary | ICD-10-CM

## 2016-01-06 DIAGNOSIS — N400 Enlarged prostate without lower urinary tract symptoms: Secondary | ICD-10-CM | POA: Diagnosis present

## 2016-01-06 DIAGNOSIS — I70248 Atherosclerosis of native arteries of left leg with ulceration of other part of lower left leg: Secondary | ICD-10-CM | POA: Diagnosis not present

## 2016-01-06 HISTORY — DX: Unspecified hearing loss, unspecified ear: H91.90

## 2016-01-06 HISTORY — DX: Sarcoidosis, unspecified: D86.9

## 2016-01-06 HISTORY — DX: Personal history of other diseases of the musculoskeletal system and connective tissue: Z87.39

## 2016-01-06 HISTORY — DX: Polyneuropathy, unspecified: G62.9

## 2016-01-06 HISTORY — DX: Calculus of kidney: N20.0

## 2016-01-06 HISTORY — DX: Hyperlipidemia, unspecified: E78.5

## 2016-01-06 HISTORY — DX: Barrett's esophagus without dysplasia: K22.70

## 2016-01-06 HISTORY — DX: Gastric ulcer, unspecified as acute or chronic, without hemorrhage or perforation: K25.9

## 2016-01-06 HISTORY — DX: Unspecified glaucoma: H40.9

## 2016-01-06 LAB — CBC
HEMATOCRIT: 34.5 % — AB (ref 40.0–52.0)
HEMOGLOBIN: 11.6 g/dL — AB (ref 13.0–18.0)
MCH: 29.9 pg (ref 26.0–34.0)
MCHC: 33.7 g/dL (ref 32.0–36.0)
MCV: 88.9 fL (ref 80.0–100.0)
Platelets: 208 10*3/uL (ref 150–440)
RBC: 3.88 MIL/uL — AB (ref 4.40–5.90)
RDW: 15.3 % — ABNORMAL HIGH (ref 11.5–14.5)
WBC: 9.7 10*3/uL (ref 3.8–10.6)

## 2016-01-06 LAB — GLUCOSE, CAPILLARY
GLUCOSE-CAPILLARY: 109 mg/dL — AB (ref 65–99)
GLUCOSE-CAPILLARY: 81 mg/dL (ref 65–99)
Glucose-Capillary: 55 mg/dL — ABNORMAL LOW (ref 65–99)
Glucose-Capillary: 97 mg/dL (ref 65–99)
Glucose-Capillary: 140 mg/dL — ABNORMAL HIGH (ref 65–99)

## 2016-01-06 LAB — COMPREHENSIVE METABOLIC PANEL
ALBUMIN: 3.1 g/dL — AB (ref 3.5–5.0)
ALK PHOS: 45 U/L (ref 38–126)
ALT: 20 U/L (ref 17–63)
ANION GAP: 8 (ref 5–15)
AST: 18 U/L (ref 15–41)
BILIRUBIN TOTAL: 0.6 mg/dL (ref 0.3–1.2)
BUN: 54 mg/dL — ABNORMAL HIGH (ref 6–20)
CALCIUM: 8.4 mg/dL — AB (ref 8.9–10.3)
CO2: 26 mmol/L (ref 22–32)
CREATININE: 2.37 mg/dL — AB (ref 0.61–1.24)
Chloride: 108 mmol/L (ref 101–111)
GFR calc non Af Amer: 26 mL/min — ABNORMAL LOW (ref >60)
GFR, EST AFRICAN AMERICAN: 30 mL/min — AB (ref >60)
GLUCOSE: 82 mg/dL (ref 65–99)
Potassium: 3.8 mmol/L (ref 3.5–5.1)
Sodium: 142 mmol/L (ref 135–145)
TOTAL PROTEIN: 7.2 g/dL (ref 6.5–8.1)

## 2016-01-06 LAB — SURGICAL PCR SCREEN
MRSA, PCR: NEGATIVE
Staphylococcus aureus: NEGATIVE

## 2016-01-06 LAB — TSH: TSH: 2.24 u[IU]/mL (ref 0.350–4.500)

## 2016-01-06 MED ORDER — ONDANSETRON HCL 4 MG/2ML IJ SOLN: 4.0000 mg | Freq: Four times a day (QID) | INTRAMUSCULAR | Status: DC | PRN

## 2016-01-06 MED ORDER — LABETALOL HCL 5 MG/ML IV SOLN
10.0000 mg | INTRAVENOUS | Status: DC | PRN
  Administered 2016-01-06 – 2016-01-07 (×2): 10 mg via INTRAVENOUS
  Filled 2016-01-06 (×2): qty 4

## 2016-01-06 MED ORDER — DEXTROSE 50 % IV SOLN
INTRAVENOUS | Status: AC
  Filled 2016-01-06: qty 50

## 2016-01-06 MED ORDER — ONDANSETRON HCL 4 MG PO TABS: 4.0000 mg | ORAL_TABLET | Freq: Four times a day (QID) | ORAL | Status: DC | PRN

## 2016-01-06 MED ORDER — VANCOMYCIN HCL 10 G IV SOLR
1500.0000 mg | Freq: Once | INTRAVENOUS | Status: AC
  Administered 2016-01-06: 1500 mg via INTRAVENOUS
  Filled 2016-01-06: qty 1500

## 2016-01-06 MED ORDER — SODIUM CHLORIDE 0.9 % IV SOLN
3.0000 g | Freq: Four times a day (QID) | INTRAVENOUS | Status: DC
  Administered 2016-01-06 – 2016-01-08 (×7): 3 g via INTRAVENOUS
  Filled 2016-01-06 (×11): qty 3

## 2016-01-06 MED ORDER — VANCOMYCIN HCL 10 G IV SOLR
1500.0000 mg | INTRAVENOUS | Status: DC
  Administered 2016-01-07: 1500 mg via INTRAVENOUS
  Filled 2016-01-06: qty 1500

## 2016-01-06 MED ORDER — ACETAMINOPHEN 325 MG PO TABS
650.0000 mg | ORAL_TABLET | Freq: Four times a day (QID) | ORAL | Status: DC | PRN
  Administered 2016-01-09: 650 mg via ORAL
  Filled 2016-01-06: qty 2

## 2016-01-06 MED ORDER — ACETAMINOPHEN 650 MG RE SUPP: 650.0000 mg | Freq: Four times a day (QID) | RECTAL | Status: DC | PRN

## 2016-01-06 MED ORDER — SODIUM CHLORIDE 0.9 % IV SOLN
250.0000 mL | INTRAVENOUS | Status: DC | PRN
  Administered 2016-01-07: 17:00:00 via INTRAVENOUS

## 2016-01-06 MED ORDER — DEXTROSE-NACL 5-0.45 % IV SOLN
INTRAVENOUS | Status: DC
Start: 2016-01-06 — End: 2016-01-10
  Administered 2016-01-06 – 2016-01-09 (×5): via INTRAVENOUS

## 2016-01-06 MED ORDER — SODIUM CHLORIDE 0.9% FLUSH: 3.0000 mL | INTRAVENOUS | Status: DC | PRN

## 2016-01-06 MED ORDER — DEXTROSE 50 % IV SOLN
25.0000 mL | Freq: Once | INTRAVENOUS | Status: AC
  Administered 2016-01-06: 25 mL via INTRAVENOUS

## 2016-01-06 MED ORDER — SODIUM CHLORIDE 0.9% FLUSH
3.0000 mL | Freq: Two times a day (BID) | INTRAVENOUS | Status: DC
  Administered 2016-01-06 – 2016-01-08 (×4): 3 mL via INTRAVENOUS

## 2016-01-06 MED ORDER — INSULIN ASPART 100 UNIT/ML ~~LOC~~ SOLN
0.0000 [IU] | Freq: Three times a day (TID) | SUBCUTANEOUS | Status: DC
  Administered 2016-01-07: 1 [IU] via SUBCUTANEOUS
  Administered 2016-01-07 – 2016-01-08 (×2): 2 [IU] via SUBCUTANEOUS
  Administered 2016-01-09: 1 [IU] via SUBCUTANEOUS
  Administered 2016-01-09 – 2016-01-10 (×3): 2 [IU] via SUBCUTANEOUS
  Filled 2016-01-06: qty 2
  Filled 2016-01-06: qty 1
  Filled 2016-01-06 (×2): qty 2
  Filled 2016-01-06: qty 1
  Filled 2016-01-06 (×2): qty 2

## 2016-01-06 MED ORDER — HYDRALAZINE HCL 20 MG/ML IJ SOLN
10.0000 mg | Freq: Four times a day (QID) | INTRAMUSCULAR | Status: DC | PRN
  Administered 2016-01-06: 10 mg via INTRAVENOUS
  Filled 2016-01-06: qty 1

## 2016-01-06 MED ORDER — HYDROCODONE-ACETAMINOPHEN 5-325 MG PO TABS
1.0000 | ORAL_TABLET | ORAL | Status: DC | PRN
  Administered 2016-01-10: 1 via ORAL
  Filled 2016-01-06: qty 2

## 2016-01-06 MED ORDER — ENOXAPARIN SODIUM 40 MG/0.4ML ~~LOC~~ SOLN
40.0000 mg | Freq: Two times a day (BID) | SUBCUTANEOUS | Status: DC
  Administered 2016-01-06 – 2016-01-10 (×7): 40 mg via SUBCUTANEOUS
  Filled 2016-01-06 (×7): qty 0.4

## 2016-01-06 NOTE — H&P (View-Only) (Signed)
Reason for Consult: Osteomyelitis left forefoot Referring Physician: Hospitalist  Jesus Alvarado is an 75 y.o. male.  HPI: The patient is well known to me outpatient with chronic history of ulcerations on her feet. Recently he has had an ulceration on the left forefoot for the past month with significant progression of the ulceration over the last few weeks. Has had local wound care but when presented in the office outpatient earlier today was noted to have a continued ulceration down to the level of the joint capsule. X-rays taken revealed bony resorption consistent with osteomyelitis and decision was made for admission to the hospital and debridement or amputation of the infected bone.  Past Medical History:  Diagnosis Date  . Barrett's esophagus   . DDD (degenerative disc disease), lumbar   . DDD (degenerative disc disease), lumbar   . Diabetes mellitus without complication (HCC)   . Gastric ulcer   . Glaucoma   . Hard of hearing   . History of spinal stenosis   . Hyperlipemia   . Hypertension   . Hypertension   . Nephrolithiasis   . Neuropathy (HCC)   . Peripheral neuropathy (HCC)   . Sarcoid [facility])     Past Surgical History:  Procedure Laterality Date  . APPENDECTOMY    . BACK SURGERY    . carpel tunnel    . CATARACT EXTRACTION    . LYMPH NODE DISSECTION    . NECK SURGERY    . ORIF ANKLE FRACTURE      Family History  Problem Relation Age of Onset  . Alcohol abuse Father     Social History:  reports that he has never smoked. He has never used smokeless tobacco. He reports that he does not drink alcohol or use drugs.  Allergies:  Allergies  Allergen Reactions  . Terazosin Hcl Other (See Comments)    Reaction:  Unknown     Medications:  Scheduled: . enoxaparin (LOVENOX) injection  40 mg Subcutaneous BID  . insulin aspart  0-9 Units Subcutaneous TID WC  . sodium chloride flush  3 mL Intravenous Q12H    No results found for this or any previous visit (from the  past 48 hour(s)).  No results found.  Review of Systems  Constitutional: Negative for chills and fever.  HENT: Negative.   Eyes: Negative.   Respiratory: Negative.   Cardiovascular: Negative.   Gastrointestinal: Negative for nausea and vomiting.  Genitourinary: Negative.   Musculoskeletal:       Chronic contractures in both hands. No specific pain.  Skin:       Significant swelling and redness in his left leg with a draining ulceration from the bottom of his left foot.  Neurological:       Significant polyneuropathy related to his diabetes.  Endo/Heme/Allergies: Negative.   Psychiatric/Behavioral: Negative.    Blood pressure (!) 178/75, pulse 64, temperature 97.8 F (36.6 C), temperature source Oral, resp. rate 20, height 6\' 2"  (1.88 m), weight (!) 145.7 kg (321 lb 1.6 oz), SpO2 94 %. Physical Exam  Cardiovascular:  DP and PT pulses are trace to nonpalpable on the left. Diminished and barely palpable on the right foot.  Musculoskeletal:  Stiff range of motion pedal joints. Fifth metatarsal is mildly plantarly displaced. Muscle testing is deferred.  Neurological:  Loss of protective threshold with a monofilament wire distally in the feet bilateral. Proprioception impaired.  Skin:  The skin is thin dry and atrophic with diminished hair growth. Significant edema with some erythema  from the mid tibia distal to the foot. Full-thickness ulceration on the plantar aspect of the left fifth metatarsal with exposed tendon and joint capsule. Some moderate purulence on his bandaging.  X-rays: 3 views of the left foot taken in the office earlier today revealed some lytic changes in the fifth metatarsal head consistent with early osteomyelitis. Significant interval change from previous films taken 2 weeks ago.  Assessment/Plan: Assessment: 1. Osteomyelitis left fifth metatarsal. 2. Diabetes with associated neuropathy.  Plan: Discussed with the patient the need for debridement of the infected  bone. Discussed possibility of amputation of the toe as well. Scuffed risks and complications including inability to heal due to his diabetes or circulation as well as the continued risk of infection and need for further surgery. We will obtain a consent form for debridement of bone left foot and/or ray resection of the fifth. Patient is nothing by mouth. At this point we'll plan for surgery later this evening. Also will obtain a vascular consult during his hospitalization for possible evaluation of diminished pulses and inability to heal.  Jesus Alvarado 01/06/2016, 12:38 PM

## 2016-01-06 NOTE — H&P (Signed)
[name] [place] [facility]   PATIENT NAME: Jesus Alvarado    MR#:  [id number]  DATE OF BIRTH:  04-14-1940  DATE OF ADMISSION:  01/06/2016  PRIMARY CARE PHYSICIAN: Dr. Cyndi [facility]  REQUESTING/REFERRING PHYSICIAN: Pamella Pert MD  CHIEF COMPLAINT:  No chief complaint on file.   HISTORY OF PRESENT ILLNESS: [name]  is a 75 y.o. male with a known history of  Barrett's esophagus, diabetes, gastric ulcer, hypertension, hyperlipidemia, peripheral neuropathy who has a chronic left foot ulcer who is being followed by podiatry. Patient was admitted at the request of podiatry for a left fifth toe osteomyelitis. Patient will be undergoing surgery later today. He complains of having pain in that left foot and swelling in both of his lower extremities. Patient also reports that he feels like his abdomen has been distended as well. He denies any chest pains or palpitations no shortness of breath no nausea vomiting or diarrhea. PAST MEDICAL HISTORY:   Past Medical History:  Diagnosis Date  . Barrett's esophagus   . DDD (degenerative disc disease), lumbar   . DDD (degenerative disc disease), lumbar   . Diabetes mellitus without complication (HCC)   . Gastric ulcer   . Glaucoma   . Hard of hearing   . History of spinal stenosis   . Hyperlipemia   . Hypertension   . Hypertension   . Nephrolithiasis   . Neuropathy (HCC)   . Peripheral neuropathy (HCC)   . Sarcoid (HCC)     PAST SURGICAL HISTORY:  Past Surgical History:  Procedure Laterality Date  . APPENDECTOMY    . BACK SURGERY    . carpel tunnel    . CATARACT EXTRACTION    . LYMPH NODE DISSECTION    . NECK SURGERY    . ORIF ANKLE FRACTURE      SOCIAL HISTORY:  Social History  Substance Use Topics  . Smoking status: Never Smoker  . Smokeless tobacco: Never Used  . Alcohol use No    FAMILY HISTORY:  Family History  Problem Relation Age of Onset  . Alcohol abuse Father     DRUG  ALLERGIES:  Allergies  Allergen Reactions  . Terazosin Hcl Other (See Comments)    Reaction:  Unknown     REVIEW OF SYSTEMS:   CONSTITUTIONAL: No fever, fatigue or weakness.  EYES: No blurred or double vision.  EARS, NOSE, AND THROAT: No tinnitus or ear pain.  RESPIRATORY: No cough, shortness of breath, wheezing or hemoptysis.  CARDIOVASCULAR: No chest pain, orthopnea, edema.  GASTROINTESTINAL: No nausea, vomiting, diarrhea or abdominal pain.  GENITOURINARY: No dysuria, hematuria.  ENDOCRINE: No polyuria, nocturia,  HEMATOLOGY: No anemia, easy bruising or bleeding SKIN: Left toe infection swelling of the bilateral lower extremity MUSCULOSKELETAL: No joint pain or arthritis.   NEUROLOGIC: No tingling, numbness, weakness.  PSYCHIATRY: No anxiety or depression.   MEDICATIONS AT HOME:  Prior to Admission medications   Medication Sig Start Date End Date Taking? Authorizing Provider  ascorbic acid (VITAMIN C) 500 MG tablet Take 500 mg by mouth 2 (two) times daily.    Historical Provider, MD  aspirin EC 325 MG tablet Take 325 mg by mouth daily.    Historical Provider, MD  bacitracin ointment Apply 1 application topically 2 (two) times daily.    Historical Provider, MD  carvedilol (COREG) 12.5 MG tablet Take 12.5 mg by mouth 2 (two) times daily.    Historical Provider, MD  cholecalciferol (VITAMIN D) 1000  UNITS tablet Take 5,000 Units by mouth daily.    Historical Provider, MD  furosemide (LASIX) 20 MG tablet Take 1 tablet (20 mg total) by mouth daily. 11/09/15 11/08/16  Minna Antis, MD  gabapentin (NEURONTIN) 100 MG capsule Take 300-500 mg by mouth 2 (two) times daily. Pt takes three capsules in the morning and five capsules in the evening.    Historical Provider, MD  glipiZIDE (GLUCOTROL) 10 MG tablet Take 20 mg by mouth 2 (two) times daily.    Historical Provider, MD  hydrochlorothiazide (HYDRODIURIL) 25 MG tablet Take 12.5 mg by mouth daily.    Historical Provider, MD  insulin  glargine (LANTUS) 100 UNIT/ML injection Inject 33 Units into the skin at bedtime.    Historical Provider, MD  lisinopril (PRINIVIL,ZESTRIL) 20 MG tablet Take 30 mg by mouth daily.    Historical Provider, MD  Multiple Vitamins-Minerals (MULTIVITAMIN PO) Take 1 tablet by mouth daily.    Historical Provider, MD  Omega-3 Fatty Acids (FISH OIL) 1000 MG CAPS Take 1 capsule by mouth 2 (two) times daily.    Historical Provider, MD  sennosides-docusate sodium (SENOKOT-S) 8.6-50 MG tablet Take 1 tablet by mouth at bedtime.    Historical Provider, MD  silver sulfADIAZINE (SILVADENE) 1 % cream Apply 1 application topically 3 (three) times daily.    Historical Provider, MD  simvastatin (ZOCOR) 20 MG tablet Take 10 mg by mouth at bedtime.    Historical Provider, MD  sucralfate (CARAFATE) 1 G tablet Take 1 g by mouth 2 (two) times daily.    Historical Provider, MD  tamsulosin (FLOMAX) 0.4 MG CAPS capsule Take 0.4 mg by mouth every evening.    Historical Provider, MD      PHYSICAL EXAMINATION:   VITAL SIGNS: Blood pressure (!) 178/75, pulse 64, temperature 97.8 F (36.6 C), temperature source Oral, resp. rate 20, height 6\' 2"  (1.88 m), weight (!) 321 lb 1.6 oz (145.7 kg), SpO2 94 %.  GENERAL:  75 y.o.-year-old patient lying in the bed with no acute distress.  EYES: Pupils equal, round, reactive to light and accommodation. No scleral icterus. Extraocular muscles intact.  HEENT: Head atraumatic, normocephalic. Oropharynx and nasopharynx clear.  NECK:  Supple, no jugular venous distention. No thyroid enlargement, no tenderness.  LUNGS: Normal breath sounds bilaterally, no wheezing, rales,rhonchi or crepitation. No use of accessory muscles of respiration.  CARDIOVASCULAR: S1, S2 normal. No murmurs, rubs, or gallops.  ABDOMEN: Soft, nontender, nondistended. Bowel sounds present. No organomegaly or mass.  EXTREMITIES: 2+ pedal edema, cyanosis, or clubbing. There is some erythema involving the left shin area. Left  fifth foot is dressed had a dressing placed earlier in the day NEUROLOGIC: Cranial nerves II through XII are intact. Muscle strength 5/5 in all extremities. Sensation intact. Gait not checked.  PSYCHIATRIC: The patient is alert and oriented x 3.  SKIN: No obvious rash, lesion, or ulcer.   LABORATORY PANEL:   CBC No results for input(s): WBC, HGB, HCT, PLT, MCV, MCH, MCHC, RDW, LYMPHSABS, MONOABS, EOSABS, BASOSABS, BANDABS in the last 168 hours.  Invalid input(s): NEUTRABS, BANDSABD ------------------------------------------------------------------------------------------------------------------  Chemistries  No results for input(s): NA, K, CL, CO2, GLUCOSE, BUN, CREATININE, CALCIUM, MG, AST, ALT, ALKPHOS, BILITOT in the last 168 hours.  Invalid input(s): GFRCGP ------------------------------------------------------------------------------------------------------------------ CrCl cannot be calculated (Patient's most recent lab result is older than the maximum 21 days allowed.). ------------------------------------------------------------------------------------------------------------------ No results for input(s): TSH, T4TOTAL, T3FREE, THYROIDAB in the last 72 hours.  Invalid input(s): FREET3   Coagulation profile No results  for input(s): INR, PROTIME in the last 168 hours. ------------------------------------------------------------------------------------------------------------------- No results for input(s): DDIMER in the last 72 hours. -------------------------------------------------------------------------------------------------------------------  Cardiac Enzymes No results for input(s): CKMB, TROPONINI, MYOGLOBIN in the last 168 hours.  Invalid input(s): CK ------------------------------------------------------------------------------------------------------------------ Invalid input(s):  POCBNP  ---------------------------------------------------------------------------------------------------------------  Urinalysis    Component Value Date/Time   COLORURINE Yellow 07/16/2012 1041   COLORURINE YELLOW 07/04/2007 2151   APPEARANCEUR Hazy 07/16/2012 1041   LABSPEC 1.018 07/16/2012 1041   PHURINE 5.0 07/16/2012 1041   PHURINE 5.0 07/04/2007 2151   GLUCOSEU 50 mg/dL 16/10/960405/07/2012 [id number]   HGBUR Negative 07/16/2012 1041   HGBUR NEGATIVE 07/04/2007 2151   BILIRUBINUR Negative 07/16/2012 1041   KETONESUR 1+ 07/16/2012 1041   KETONESUR NEGATIVE 07/04/2007 2151   PROTEINUR >=500 07/16/2012 1041   PROTEINUR NEGATIVE 07/04/2007 2151   UROBILINOGEN 0.2 07/04/2007 2151   NITRITE Negative 07/16/2012 1041   NITRITE NEGATIVE 07/04/2007 2151   LEUKOCYTESUR Negative 07/16/2012 1041     RADIOLOGY: No results found.  EKG: Orders placed or performed during the hospital encounter of 11/09/15  . ED EKG within 10 minutes  . ED EKG within 10 minutes  . EKG 12-Lead  . EKG 12-Lead  . EKG    IMPRESSION AND PLAN: Patient is a 75 year old white male with multiple medical problems being admitted for left 1. Left leg cellulitis associated with left fifth toe infection I will place patient on vancomycin and Zosyn for now Plan for surgery later today  2. Diabetes type 2 I will place patient on sliding scale insulin every 6 hours for now once his surgery is done we continued to every before meals daily at bedtime Patient's list the medication is not updated  3. Hypertension accelerated at this time I will give him a dose of IV hydralazine times once will obtain his home medications resumed once available  4. Swelling of the lower extremity and abdomen he has some fluid retention Will obtain echocardiogram of the heart to evaluate the heart function   5. Hyperlipidemia We'll resume home medications  6. Miscellaneous Lovenox postop         All the records are reviewed and  case discussed with ED provider. Management plans discussed with the patient, family and they are in agreement.  CODE STATUS: Code Status History    This patient does not have a recorded code status. Please follow your organizational policy for patients in this situation.    Advance Directive Documentation   Flowsheet Row Most Recent Value  Type of Advance Directive  Healthcare Power of Attorney  Pre-existing out of facility DNR order (yellow form or pink MOST form)  No data  "MOST" Form in Place?  No data       TOTAL TIME TAKING CARE OF THIS PATIENT: 55 minutes.    Auburn BilberryPATEL, Jalisha Enneking M.D on 01/06/2016 at 12:25 PM  Between 7am to 6pm - Pager - [phone]  After 6pm go to [email or web address] - password [email or web address]  [facility]  Office  [phone]  CC: Primary care physician; PROVIDER NOT IN SYSTEM

## 2016-01-06 NOTE — Progress Notes (Signed)
Pt sats dropped to 87% at rest. Notified prime doc and order given to put on 02. O2 running at 2L via nasal cannula. Adelina MingsKim Jahyra Sukup RN

## 2016-01-06 NOTE — Consult Note (Signed)
Reason for Consult: Osteomyelitis left forefoot Referring Physician: Hospitalist  Jesus Alvarado is an 75 y.o. male.  HPI: The patient is well known to me outpatient with chronic history of ulcerations on her feet. Recently he has had an ulceration on the left forefoot for the past month with significant progression of the ulceration over the last few weeks. Has had local wound care but when presented in the office outpatient earlier today was noted to have a continued ulceration down to the level of the joint capsule. X-rays taken revealed bony resorption consistent with osteomyelitis and decision was made for admission to the hospital and debridement or amputation of the infected bone.  Past Medical History:  Diagnosis Date  . Barrett's esophagus   . DDD (degenerative disc disease), lumbar   . DDD (degenerative disc disease), lumbar   . Diabetes mellitus without complication (HCC)   . Gastric ulcer   . Glaucoma   . Hard of hearing   . History of spinal stenosis   . Hyperlipemia   . Hypertension   . Hypertension   . Nephrolithiasis   . Neuropathy (HCC)   . Peripheral neuropathy (HCC)   . Sarcoid (HCC)     Past Surgical History:  Procedure Laterality Date  . APPENDECTOMY    . BACK SURGERY    . carpel tunnel    . CATARACT EXTRACTION    . LYMPH NODE DISSECTION    . NECK SURGERY    . ORIF ANKLE FRACTURE      Family History  Problem Relation Age of Onset  . Alcohol abuse Father     Social History:  reports that he has never smoked. He has never used smokeless tobacco. He reports that he does not drink alcohol or use drugs.  Allergies:  Allergies  Allergen Reactions  . Terazosin Hcl Other (See Comments)    Reaction:  Unknown     Medications:  Scheduled: . enoxaparin (LOVENOX) injection  40 mg Subcutaneous BID  . insulin aspart  0-9 Units Subcutaneous TID WC  . sodium chloride flush  3 mL Intravenous Q12H    No results found for this or any previous visit (from the  past 48 hour(s)).  No results found.  Review of Systems  Constitutional: Negative for chills and fever.  HENT: Negative.   Eyes: Negative.   Respiratory: Negative.   Cardiovascular: Negative.   Gastrointestinal: Negative for nausea and vomiting.  Genitourinary: Negative.   Musculoskeletal:       Chronic contractures in both hands. No specific pain.  Skin:       Significant swelling and redness in his left leg with a draining ulceration from the bottom of his left foot.  Neurological:       Significant polyneuropathy related to his diabetes.  Endo/Heme/Allergies: Negative.   Psychiatric/Behavioral: Negative.    Blood pressure (!) 178/75, pulse 64, temperature 97.8 F (36.6 C), temperature source Oral, resp. rate 20, height 6' 2" (1.88 m), weight (!) 145.7 kg (321 lb 1.6 oz), SpO2 94 %. Physical Exam  Cardiovascular:  DP and PT pulses are trace to nonpalpable on the left. Diminished and barely palpable on the right foot.  Musculoskeletal:  Stiff range of motion pedal joints. Fifth metatarsal is mildly plantarly displaced. Muscle testing is deferred.  Neurological:  Loss of protective threshold with a monofilament wire distally in the feet bilateral. Proprioception impaired.  Skin:  The skin is thin dry and atrophic with diminished hair growth. Significant edema with some erythema   from the mid tibia distal to the foot. Full-thickness ulceration on the plantar aspect of the left fifth metatarsal with exposed tendon and joint capsule. Some moderate purulence on his bandaging.  X-rays: 3 views of the left foot taken in the office earlier today revealed some lytic changes in the fifth metatarsal head consistent with early osteomyelitis. Significant interval change from previous films taken 2 weeks ago.  Assessment/Plan: Assessment: 1. Osteomyelitis left fifth metatarsal. 2. Diabetes with associated neuropathy.  Plan: Discussed with the patient the need for debridement of the infected  bone. Discussed possibility of amputation of the toe as well. Scuffed risks and complications including inability to heal due to his diabetes or circulation as well as the continued risk of infection and need for further surgery. We will obtain a consent form for debridement of bone left foot and/or ray resection of the fifth. Patient is nothing by mouth. At this point we'll plan for surgery later this evening. Also will obtain a vascular consult during his hospitalization for possible evaluation of diminished pulses and inability to heal.  Haileigh Pitz W Mariana Goytia 01/06/2016, 12:38 PM     

## 2016-01-06 NOTE — Interval H&P Note (Signed)
History and Physical Interval Note:  01/06/2016 12:48 PM  Jesus Alvarado  has presented today for surgery, with the diagnosis of N/A  The various methods of treatment have been discussed with the patient and family. After consideration of risks, benefits and other options for treatment, the patient has consented to  Procedure(s): IRRIGATION AND DEBRIDEMENT FOOT (Left) as  well as possible fifth ray resection as surgical intervention .  The patient's history has been reviewed, patient examined, no change in status, stable for surgery.  I have reviewed the patient's chart and labs.  Questions were answered to the patient's satisfaction.     Ricci Barkerodd W Hyland Mollenkopf

## 2016-01-06 NOTE — Progress Notes (Signed)
*  PRELIMINARY RESULTS* Echocardiogram 2D Echocardiogram has been performed.  Cristela BlueHege, Raena Pau 01/06/2016, 2:04 PM

## 2016-01-06 NOTE — Progress Notes (Signed)
Pharmacy Antibiotic Note  Jesus Alvarado is a 75 y.o. male admitted on 01/06/2016 with cellulitis/Osteomyeltitis.  Pharmacy has been consulted for Vancomycin and Unasyn dosing.  Plan: Vancomycin 1500 IV every 24 hours.  Goal trough 15-20 mcg/mL. Will give stacked dosing and schedule trough prior to 4th dose 10/28. F/u renal fxn Ke= 0.031  T1.2 22.36  Vd 75.32  -Will begin Unasyn 3 gm IV q6h.    Height: 6\' 2"  (188 cm) Weight: (!) 321 lb 1.6 oz (145.7 kg) IBW/kg (Calculated) : 82.2  Adj BW= 107.6 kg  Temp (24hrs), Avg:97.8 F (36.6 C), Min:97.8 F (36.6 C), Max:97.8 F (36.6 C)   Recent Labs Lab 01/06/16 1254  WBC 9.7  CREATININE 2.37*    Estimated Creatinine Clearance: 42.2 mL/min (by C-G formula based on SCr of 2.37 mg/dL (H)).    Allergies  Allergen Reactions  . Terazosin Hcl Other (See Comments)    Reaction:  Unknown     Antimicrobials this admission: Unasyn 10/25 >>   Vanc 10/25 >>    Dose adjustments this admission:    Microbiology results:   BCx:     UCx:      Sputum:    10/25 MRSA PCR: neg  Thank you for allowing pharmacy to be a part of this patient's care.  Andranik Jeune A 01/06/2016 2:55 PM

## 2016-01-07 ENCOUNTER — Encounter
Admission: AD | Disposition: A | Discharge status: Home Health | Payer: Self-pay | Source: Ambulatory Visit | Attending: Internal Medicine

## 2016-01-07 ENCOUNTER — Inpatient Hospital Stay: Payer: Non-veteran care | Admitting: Anesthesiology

## 2016-01-07 ENCOUNTER — Encounter: Payer: Self-pay | Admitting: Anesthesiology

## 2016-01-07 DIAGNOSIS — M86172 Other acute osteomyelitis, left ankle and foot: Secondary | ICD-10-CM

## 2016-01-07 DIAGNOSIS — M869 Osteomyelitis, unspecified: Secondary | ICD-10-CM | POA: Diagnosis present

## 2016-01-07 DIAGNOSIS — R600 Localized edema: Secondary | ICD-10-CM

## 2016-01-07 DIAGNOSIS — I10 Essential (primary) hypertension: Secondary | ICD-10-CM

## 2016-01-07 DIAGNOSIS — N184 Chronic kidney disease, stage 4 (severe): Secondary | ICD-10-CM

## 2016-01-07 HISTORY — PX: IRRIGATION AND DEBRIDEMENT FOOT: SHX6602

## 2016-01-07 LAB — GLUCOSE, CAPILLARY
GLUCOSE-CAPILLARY: 125 mg/dL — AB (ref 65–99)
GLUCOSE-CAPILLARY: 116 mg/dL — AB (ref 65–99)
GLUCOSE-CAPILLARY: 124 mg/dL — AB (ref 65–99)
Glucose-Capillary: 168 mg/dL — ABNORMAL HIGH (ref 65–99)

## 2016-01-07 LAB — ECHOCARDIOGRAM COMPLETE
HEIGHTINCHES: 74 in
Weight: 5137.6 oz

## 2016-01-07 LAB — BASIC METABOLIC PANEL
ANION GAP: 9 (ref 5–15)
BUN: 43 mg/dL — ABNORMAL HIGH (ref 6–20)
CHLORIDE: 106 mmol/L (ref 101–111)
CO2: 27 mmol/L (ref 22–32)
Calcium: 8.4 mg/dL — ABNORMAL LOW (ref 8.9–10.3)
Creatinine, Ser: 1.94 mg/dL — ABNORMAL HIGH (ref 0.61–1.24)
GFR, EST AFRICAN AMERICAN: 38 mL/min — AB (ref >60)
GFR, EST NON AFRICAN AMERICAN: 33 mL/min — AB (ref >60)
Glucose, Bld: 105 mg/dL — ABNORMAL HIGH (ref 65–99)
POTASSIUM: 3.7 mmol/L (ref 3.5–5.1)
SODIUM: 142 mmol/L (ref 135–145)

## 2016-01-07 LAB — CBC
HCT: 38.5 % — ABNORMAL LOW (ref 40.0–52.0)
HEMOGLOBIN: 12.7 g/dL — AB (ref 13.0–18.0)
MCH: 29.9 pg (ref 26.0–34.0)
MCHC: 33 g/dL (ref 32.0–36.0)
MCV: 90.7 fL (ref 80.0–100.0)
PLATELETS: 208 10*3/uL (ref 150–440)
RBC: 4.25 MIL/uL — AB (ref 4.40–5.90)
RDW: 15.3 % — ABNORMAL HIGH (ref 11.5–14.5)
WBC: 10.6 10*3/uL (ref 3.8–10.6)

## 2016-01-07 SURGERY — IRRIGATION AND DEBRIDEMENT FOOT
Anesthesia: General | Laterality: Left

## 2016-01-07 MED ORDER — ONDANSETRON HCL 4 MG/2ML IJ SOLN: 4.0000 mg | Freq: Once | INTRAMUSCULAR | Status: DC | PRN

## 2016-01-07 MED ORDER — FENTANYL CITRATE (PF) 100 MCG/2ML IJ SOLN
INTRAMUSCULAR | Status: DC | PRN
  Administered 2016-01-07 (×2): 50 ug via INTRAVENOUS

## 2016-01-07 MED ORDER — ASPIRIN EC 325 MG PO TBEC
325.0000 mg | DELAYED_RELEASE_TABLET | Freq: Every day | ORAL | Status: DC
  Administered 2016-01-07 – 2016-01-10 (×3): 325 mg via ORAL
  Filled 2016-01-07 (×3): qty 1

## 2016-01-07 MED ORDER — SUCRALFATE 1 G PO TABS
1.0000 g | ORAL_TABLET | Freq: Two times a day (BID) | ORAL | Status: DC
  Administered 2016-01-07 – 2016-01-10 (×6): 1 g via ORAL
  Filled 2016-01-07 (×6): qty 1

## 2016-01-07 MED ORDER — VANCOMYCIN HCL 1000 MG IV SOLR
INTRAVENOUS | Status: DC | PRN
  Administered 2016-01-07: 1 g

## 2016-01-07 MED ORDER — FUROSEMIDE 10 MG/ML IJ SOLN
40.0000 mg | Freq: Once | INTRAMUSCULAR | Status: AC
  Administered 2016-01-07: 40 mg via INTRAVENOUS
  Filled 2016-01-07: qty 4

## 2016-01-07 MED ORDER — GABAPENTIN 400 MG PO CAPS
500.0000 mg | ORAL_CAPSULE | Freq: Every day | ORAL | Status: DC
  Administered 2016-01-07 – 2016-01-09 (×3): 500 mg via ORAL
  Filled 2016-01-07 (×4): qty 1

## 2016-01-07 MED ORDER — VITAMIN C 500 MG PO TABS
500.0000 mg | ORAL_TABLET | Freq: Two times a day (BID) | ORAL | Status: DC
  Administered 2016-01-07 – 2016-01-10 (×7): 500 mg via ORAL
  Filled 2016-01-07 (×7): qty 1

## 2016-01-07 MED ORDER — EPHEDRINE SULFATE 50 MG/ML IJ SOLN
INTRAMUSCULAR | Status: DC | PRN
  Administered 2016-01-07: 10 mg via INTRAVENOUS

## 2016-01-07 MED ORDER — INSULIN GLARGINE 100 UNIT/ML ~~LOC~~ SOLN
33.0000 [IU] | Freq: Every day | SUBCUTANEOUS | Status: DC
  Administered 2016-01-07 – 2016-01-09 (×3): 33 [IU] via SUBCUTANEOUS
  Filled 2016-01-07 (×4): qty 0.33

## 2016-01-07 MED ORDER — FENTANYL CITRATE (PF) 100 MCG/2ML IJ SOLN: 25.0000 ug | INTRAMUSCULAR | Status: DC | PRN

## 2016-01-07 MED ORDER — GLYCOPYRROLATE 0.2 MG/ML IJ SOLN
INTRAMUSCULAR | Status: DC | PRN
  Administered 2016-01-07: 0.2 mg via INTRAVENOUS

## 2016-01-07 MED ORDER — FUROSEMIDE 20 MG PO TABS: 20.0000 mg | ORAL_TABLET | Freq: Every day | ORAL | Status: DC

## 2016-01-07 MED ORDER — BUPIVACAINE HCL (PF) 0.5 % IJ SOLN
INTRAMUSCULAR | Status: DC | PRN
  Administered 2016-01-07: 8 mL

## 2016-01-07 MED ORDER — CARVEDILOL 12.5 MG PO TABS
12.5000 mg | ORAL_TABLET | Freq: Two times a day (BID) | ORAL | Status: DC
  Administered 2016-01-07 – 2016-01-10 (×7): 12.5 mg via ORAL
  Filled 2016-01-07 (×8): qty 1

## 2016-01-07 MED ORDER — TAMSULOSIN HCL 0.4 MG PO CAPS
0.4000 mg | ORAL_CAPSULE | Freq: Every evening | ORAL | Status: DC
  Administered 2016-01-08 – 2016-01-09 (×2): 0.4 mg via ORAL
  Filled 2016-01-07 (×2): qty 1

## 2016-01-07 MED ORDER — SIMVASTATIN 10 MG PO TABS
10.0000 mg | ORAL_TABLET | Freq: Every day | ORAL | Status: DC
  Administered 2016-01-07 – 2016-01-09 (×3): 10 mg via ORAL
  Filled 2016-01-07 (×3): qty 1

## 2016-01-07 MED ORDER — GABAPENTIN 300 MG PO CAPS
300.0000 mg | ORAL_CAPSULE | Freq: Every morning | ORAL | Status: DC
  Administered 2016-01-07 – 2016-01-10 (×4): 300 mg via ORAL
  Filled 2016-01-07 (×4): qty 1

## 2016-01-07 MED ORDER — AMLODIPINE BESYLATE 5 MG PO TABS
5.0000 mg | ORAL_TABLET | Freq: Every day | ORAL | Status: DC
  Administered 2016-01-07 – 2016-01-10 (×4): 5 mg via ORAL
  Filled 2016-01-07 (×4): qty 1

## 2016-01-07 MED ORDER — MORPHINE SULFATE (PF) 2 MG/ML IV SOLN: 2.0000 mg | INTRAVENOUS | Status: DC | PRN

## 2016-01-07 MED ORDER — VANCOMYCIN HCL 10 G IV SOLR
1500.0000 mg | INTRAVENOUS | Status: DC
  Administered 2016-01-07 – 2016-01-09 (×3): 1500 mg via INTRAVENOUS
  Filled 2016-01-07 (×4): qty 1500

## 2016-01-07 MED ORDER — SENNOSIDES-DOCUSATE SODIUM 8.6-50 MG PO TABS
1.0000 | ORAL_TABLET | Freq: Every day | ORAL | Status: DC
  Administered 2016-01-07 – 2016-01-09 (×3): 1 via ORAL
  Filled 2016-01-07 (×3): qty 1

## 2016-01-07 MED ORDER — VITAMIN D 1000 UNITS PO TABS
5000.0000 [IU] | ORAL_TABLET | Freq: Every day | ORAL | Status: DC
  Administered 2016-01-07 – 2016-01-10 (×4): 5000 [IU] via ORAL
  Filled 2016-01-07 (×4): qty 5

## 2016-01-07 MED ORDER — PROPOFOL 10 MG/ML IV BOLUS
INTRAVENOUS | Status: DC | PRN
  Administered 2016-01-07: 150 mg via INTRAVENOUS

## 2016-01-07 MED ORDER — ONDANSETRON HCL 4 MG/2ML IJ SOLN
INTRAMUSCULAR | Status: DC | PRN
  Administered 2016-01-07: 4 mg via INTRAVENOUS

## 2016-01-07 SURGICAL SUPPLY — 45 items
BANDAGE ELASTIC 4 LF NS (GAUZE/BANDAGES/DRESSINGS) ×2 IMPLANT
BLADE OSC/SAGITTAL MD 5.5X18 (BLADE) ×2 IMPLANT
BLADE OSCILLATING/SAGITTAL (BLADE)
BLADE SURG 15 STRL LF DISP TIS (BLADE) ×1 IMPLANT
BLADE SURG 15 STRL SS (BLADE) ×1
BLADE SW THK.38XMED LNG THN (BLADE) IMPLANT
BNDG ESMARK 4X12 TAN STRL LF (GAUZE/BANDAGES/DRESSINGS) IMPLANT
BNDG GAUZE 4.5X4.1 6PLY STRL (MISCELLANEOUS) ×2 IMPLANT
CANISTER SUCT 1200ML W/VALVE (MISCELLANEOUS) ×2 IMPLANT
CUFF TOURN 18 STER (MISCELLANEOUS) ×2 IMPLANT
CUFF TOURN DUAL PL 12 NO SLV (MISCELLANEOUS) IMPLANT
DRAPE FLUOR MINI C-ARM 54X84 (DRAPES) IMPLANT
DURAPREP 26ML APPLICATOR (WOUND CARE) ×2 IMPLANT
ELECT REM PT RETURN 9FT ADLT (ELECTROSURGICAL) ×2
ELECTRODE REM PT RTRN 9FT ADLT (ELECTROSURGICAL) ×1 IMPLANT
GAUZE PETRO XEROFOAM 1X8 (MISCELLANEOUS) ×2 IMPLANT
GAUZE SPONGE 4X4 12PLY STRL (GAUZE/BANDAGES/DRESSINGS) ×2 IMPLANT
GAUZE STRETCH 2X75IN STRL (MISCELLANEOUS) ×2 IMPLANT
GLOVE BIO SURGEON STRL SZ7.5 (GLOVE) ×2 IMPLANT
GLOVE INDICATOR 8.0 STRL GRN (GLOVE) ×2 IMPLANT
GOWN STRL REUS W/ TWL LRG LVL3 (GOWN DISPOSABLE) ×2 IMPLANT
GOWN STRL REUS W/TWL LRG LVL3 (GOWN DISPOSABLE) ×2
HANDPIECE VERSAJET DEBRIDEMENT (MISCELLANEOUS) ×2 IMPLANT
KIT STIMULAN RAPID CURE 5CC (Orthopedic Implant) ×2 IMPLANT
LABEL OR SOLS (LABEL) IMPLANT
NEEDLE FILTER BLUNT 18X 1/2SAF (NEEDLE) ×1
NEEDLE FILTER BLUNT 18X1 1/2 (NEEDLE) ×1 IMPLANT
NEEDLE HYPO 25X1 1.5 SAFETY (NEEDLE) ×6 IMPLANT
NS IRRIG 500ML POUR BTL (IV SOLUTION) ×2 IMPLANT
PACK EXTREMITY ARMC (MISCELLANEOUS) ×2 IMPLANT
PAD ABD DERMACEA PRESS 5X9 (GAUZE/BANDAGES/DRESSINGS) ×4 IMPLANT
PENCIL ELECTRO HAND CTR (MISCELLANEOUS) ×2 IMPLANT
RASP SM TEAR CROSS CUT (RASP) IMPLANT
SOL PREP PVP 2OZ (MISCELLANEOUS) ×2
SOLUTION PREP PVP 2OZ (MISCELLANEOUS) ×1 IMPLANT
STOCKINETTE STRL 4IN 9604848 (GAUZE/BANDAGES/DRESSINGS) ×2 IMPLANT
STOCKINETTE STRL 6IN 960660 (GAUZE/BANDAGES/DRESSINGS) ×2 IMPLANT
SUT ETHILON 4-0 (SUTURE)
SUT ETHILON 4-0 FS2 18XMFL BLK (SUTURE)
SUT VIC AB 3-0 SH 27 (SUTURE) ×1
SUT VIC AB 3-0 SH 27X BRD (SUTURE) ×1 IMPLANT
SUT VIC AB 4-0 FS2 27 (SUTURE) ×2 IMPLANT
SUTURE ETHLN 4-0 FS2 18XMF BLK (SUTURE) IMPLANT
SYR 3ML LL SCALE MARK (SYRINGE) ×2 IMPLANT
SYRINGE 10CC LL (SYRINGE) ×4 IMPLANT

## 2016-01-07 NOTE — Progress Notes (Signed)
Pharmacy Antibiotic Note  Jesus Alvarado is a 75 y.o. male admitted on 01/06/2016 with cellulitis/Osteomyeltitis.  Pharmacy has been consulted for Vancomycin and Unasyn dosing.  Plan: Current orders for vancomycin 1500mg  IV Q24H. Renal function improving, will change to vancomycin 1500mg  IV Q18H to target trough of 15-4620mcg/ml Trough prior to 4th dose, which should be steady state.  Follow renal function closely. Obese patient at risk for drug accumulation.  Continue Unasyn 3gm IV Q6H   Height: 6\' 2"  (188 cm) Weight: (!) 321 lb 1.6 oz (145.7 kg) IBW/kg (Calculated) : 82.2  Adj BW= 107.6 kg  Temp (24hrs), Avg:97.8 F (36.6 C), Min:97.5 F (36.4 C), Max:98.1 F (36.7 C)   Recent Labs Lab 01/06/16 1254 01/07/16 0456  WBC 9.7 10.6  CREATININE 2.37* 1.94*    Estimated Creatinine Clearance: 51.6 mL/min (by C-G formula based on SCr of 1.94 mg/dL (H)).    Allergies  Allergen Reactions  . Terazosin Hcl Other (See Comments)    Reaction:  Unknown     Antimicrobials this admission: Unasyn 10/25 >>   Vanc 10/25 >>    Dose adjustments this admission:    Microbiology results:   BCx:     UCx:      Sputum:    10/25 MRSA PCR: neg  Thank you for allowing pharmacy to be a part of this patient's care.  Shateria Paternostro C 01/07/2016 2:01 PM

## 2016-01-07 NOTE — Anesthesia Preprocedure Evaluation (Signed)
Anesthesia Evaluation  Patient identified by MRN, date of birth, ID band Patient awake    Reviewed: Allergy & Precautions, H&P , NPO status , Patient's Chart, lab work & pertinent test results, reviewed documented beta blocker date and time   History of Anesthesia Complications Negative for: history of anesthetic complications  Airway Mallampati: III  TM Distance: >3 FB Neck ROM: full    Dental  (+) Poor Dentition, Chipped, Missing   Pulmonary shortness of breath and with exertion, sleep apnea , neg COPD, neg recent URI,  Left sided lung mass   Pulmonary exam normal breath sounds clear to auscultation       Cardiovascular Exercise Tolerance: Good hypertension, (-) angina(-) CAD, (-) Past MI, (-) Cardiac Stents and (-) CABG Normal cardiovascular exam(-) dysrhythmias + Valvular Problems/Murmurs  Rhythm:regular Rate:Normal     Neuro/Psych neg Seizures PSYCHIATRIC DISORDERS (Depression and anxiety)  Neuromuscular disease (peripheral neuropathy) negative neurological ROS     GI/Hepatic Neg liver ROS, PUD, GERD  ,  Endo/Other  diabetes, Insulin DependentMorbid obesity  Renal/GU Renal disease (kidney stones)  negative genitourinary   Musculoskeletal   Abdominal   Peds  Hematology negative hematology ROS (+)   Anesthesia Other Findings Past Medical History: No date: Barrett's esophagus No date: DDD (degenerative disc disease), lumbar No date: DDD (degenerative disc disease), lumbar No date: Diabetes mellitus without complication (HCC) No date: Gastric ulcer No date: Glaucoma No date: Hard of hearing No date: History of spinal stenosis No date: Hyperlipemia No date: Hypertension No date: Hypertension No date: Nephrolithiasis No date: Neuropathy (HCC) No date: Peripheral neuropathy (HCC) No date: Sarcoid (HCC)   Reproductive/Obstetrics negative OB ROS                             Anesthesia  Physical Anesthesia Plan  ASA: III  Anesthesia Plan: General   Post-op Pain Management:    Induction:   Airway Management Planned:   Additional Equipment:   Intra-op Plan:   Post-operative Plan:   Informed Consent: I have reviewed the patients History and Physical, chart, labs and discussed the procedure including the risks, benefits and alternatives for the proposed anesthesia with the patient or authorized representative who has indicated his/her understanding and acceptance.   Dental Advisory Given  Plan Discussed with: Anesthesiologist, CRNA and Surgeon  Anesthesia Plan Comments:         Anesthesia Quick Evaluation

## 2016-01-07 NOTE — Care Management Note (Signed)
Case Management Note  Patient Details  Name: Jesus Alvarado MRN: [id number] Date of Birth: 04/19/1940  Subjective/Objective:    Patient admitted with osteomyelitis.  Patient lives at home alone.  [facility]  [facility], [facility]usal form and H&[facility] to [facility].  PCP Meredith ModyStein.  Patient receives home health aid services provided by the [facility] 5 days a week, 2 hours a day.  Patient receives home health RN, PT, and [facility].  Elnita Maxwellheryl with [facility] notified of admission.   PT consult pending            Action/Plan: Following for home health needs, and potential IV antibiotics .   Expected Discharge Date:                  Expected Discharge Plan:     In-House Referral:     Discharge planning Services     Post Acute Care Choice:    Choice offered to:     DME Arranged:    DME Agency:     HH Arranged:    HH Agency:     Status of Service:     If discussed at [employer] Meetings, dates discussed:    Additional Comments:  Chapman FitchBOWEN, Elly Haffey T, RN 01/07/2016, 2:41 PM

## 2016-01-07 NOTE — Progress Notes (Signed)
Primary Nurse paged and spoke to Dr. Wyn Quakerew in regard to orders that were discontinued earlier in the  day; (Lower Extremity Angiography and Consent for left leg Angiogram with possible Revascularization) Dr. Wyn Quakerew Informed Primary RN that orders should have not been canceled and that pt will have procedure in the AM. Primary nurse to reorder.

## 2016-01-07 NOTE — Op Note (Signed)
Date of operation: 01/07/2016.  Surgeon: Ricci Barkerodd W Micha Erck DPM.  Preoperative diagnosis: Osteomyelitis left fifth metatarsal.  Postoperative diagnosis: Same.  Procedure: Debridement infected bone left fifth metatarsal and proximal phalanx.  Anesthesia: LMA with local.  Hemostasis: Pneumatic tourniquet left ankle 250 mmHg.  Estimated blood loss: Less than 5 cc.  Pathology: Left fifth metatarsal and base of the fifth toe.  Cultures: Bone culture left fifth metatarsal.  Implants: Stimulan rapid cure antibody beads impregnated with vancomycin.  Drains: None.  Consultations: None apparent.  Operative indications: This is a 75 year old male with a history of ulceration on his left foot which has recently progressed down to the level of infection in the bone. Patient was admitted for IV antibiotics and definitive debridement of the foot.  Operative procedure: Patient was taken the operating room and placed on the table in the supine position. Following satisfactory LMA anesthesia the left foot was anesthetized with 8 cc of 0.5% bupivacaine plain around the fifth metatarsal region. A pneumatic tourniquet was applied at the level of the left ankle and the foot was prepped and draped in usual sterile fashion. Foot was exsanguinated and the tourniquet inflated to 250 mmHg. Attention was then directed to the dorsal aspect of the left foot where an approximate 4 cm linear incision was made coursing proximal to distal centered over the fifth metatarsal and metatarsophalangeal joint. Incision was deepened down to the level of the bone and joint sharply using a 15 blade. The tissues were reflected off of the medial and lateral and plantar aspect of the fifth metatarsal and base of the proximal phalanx of the fifth toe. Using a pneumatic saw the distal aspect of the fifth metatarsal was transected and removed in toto. The base of the fifth toe proximal phalanx was then transected and removed. The tissues  throughout the base of the wound as well as the plantar ulceration were then debrided using a versa jet debrider on a setting of 7 down through all layers of tissue to the level of bone. The wound was then washed with a versa jet on a setting of 2 to flush out the wound. 2 3-0 nylon simple interrupted sutures were then placed along the plantar ulceration to partially close this and then the Stimulan rapid cure antibiotic beads impregnated with vancomycin were then placed into the dorsal incision. The incision was then closed using 3-0 nylon supple interrupted sutures. Xeroform 4 x 4's ABDs Kerlix and an Ace wrap were applied to the left lower extremity. The tourniquet was released and blood flow returned to the left foot. Patient tolerated the procedure and anesthesia well and was transported to the PACU with vital signs stable and in good condition.

## 2016-01-07 NOTE — Progress Notes (Signed)
[facility] [place] [facility]   PATIENT NAME: Jesus Alvarado    MRN#:  [id number]  DATE OF BIRTH:  05/28/1940  SUBJECTIVE:  Hospital Day: 1 day Jesus Alvarado is a [age] y.o. male presenting with Chronic foot wound.   Overnight events: No overnight events Interval Events: Complains of bilateral leg swelling-chronic otherwise no further complaints  REVIEW OF SYSTEMS:  CONSTITUTIONAL: No fever, fatigue or weakness.  EYES: No blurred or double vision.  EARS, NOSE, AND THROAT: No tinnitus or ear pain.  RESPIRATORY: No cough, shortness of breath, wheezing or hemoptysis.  CARDIOVASCULAR: No chest pain, orthopnea, Positive edema.  GASTROINTESTINAL: No nausea, vomiting, diarrhea or abdominal pain.  GENITOURINARY: No dysuria, hematuria.  ENDOCRINE: No polyuria, nocturia,  HEMATOLOGY: No anemia, easy bruising or bleeding SKIN: Positive ulcers on left foot otherwise No rash or lesion. MUSCULOSKELETAL: No joint pain or arthritis.   NEUROLOGIC: No tingling, numbness, weakness.  PSYCHIATRY: No anxiety or depression.   DRUG ALLERGIES:   Allergies  Allergen Reactions  . Terazosin Hcl Other (See Comments)    Reaction:  Unknown     VITALS:  Blood pressure (!) 155/77, pulse 71, temperature 97.9 F (36.6 C), temperature source Oral, resp. rate 20, height 6\' 2"  (1.88 m), weight (!) 145.7 kg (321 lb 1.6 oz), SpO2 96 %.  PHYSICAL EXAMINATION:  VITAL SIGNS: Vitals:   01/07/16 0452 01/07/16 0932  BP: (!) 162/86 (!) 155/77  Pulse:  71  Resp:  20  Temp:  97.9 F (36.6 C)   GENERAL:75 y.o.male currently in no acute distress.  HEAD: Normocephalic, atraumatic.  EYES: Pupils equal, round, reactive to light. Extraocular muscles intact. No scleral icterus.  MOUTH: Moist mucosal membrane. Dentition intact. No abscess noted.  EAR, NOSE, THROAT: Clear without exudates. No external lesions.  NECK: Supple. No thyromegaly. No nodules. No JVD.  PULMONARY: Clear to ascultation,  without wheeze rails or rhonci. No use of accessory muscles, Good respiratory effort. good air entry bilaterally CHEST: Nontender to palpation.  CARDIOVASCULAR: S1 and S2. Regular rate and rhythm. No murmurs, rubs, or gallops. 2-3+ edema. Pedal pulses 2+ bilaterally.  GASTROINTESTINAL: Soft, nontender, nondistended. No masses. Positive bowel sounds. No hepatosplenomegaly.  MUSCULOSKELETAL: No swelling, clubbing, or edema. Range of motion full in all extremities.  NEUROLOGIC: Cranial nerves II through XII are intact. No gross focal neurological deficits. Sensation intact. Reflexes intact.  SKIN: Ulceration plantar aspect of foot base of great toe, fifth metatarsal head ulceration otherwise No further ulceration, lesions, rashes, or cyanosis. Skin warm and dry. Turgor intact.  PSYCHIATRIC: Mood, affect within normal limits. The patient is awake, alert and oriented x 3. Insight, judgment intact.      LABORATORY PANEL:   CBC  Recent Labs Lab 01/07/16 0456  WBC 10.6  HGB 12.7*  HCT 38.5*  PLT 208   ------------------------------------------------------------------------------------------------------------------  Chemistries   Recent Labs Lab 01/06/16 1254 01/07/16 0456  NA 142 142  K 3.8 3.7  CL 108 106  CO2 26 27  GLUCOSE 82 105*  BUN 54* 43*  CREATININE 2.37* 1.94*  CALCIUM 8.4* 8.4*  AST 18  --   ALT 20  --   ALKPHOS 45  --   BILITOT 0.6  --    ------------------------------------------------------------------------------------------------------------------  Cardiac Enzymes No results for input(s): TROPONINI in the last 168 hours. ------------------------------------------------------------------------------------------------------------------  RADIOLOGY:  No results found.  EKG:   Orders placed or performed during the hospital encounter of 11/09/15  . ED EKG within 10 minutes  .  ED EKG within 10 minutes  . EKG 12-Lead  . EKG 12-Lead  . EKG    ASSESSMENT  AND PLAN:   Jesus Alvarado is a 75 y.o. male presenting with No chief complaint on file. . Admitted 01/06/2016 : Day #: 1 day 1. Osteomyelitis left foot-podiatry input and vascular input appreciated 4 debridement and possible amputation today, vascular evaluation tomorrow, continue current antibiotics follow culture data and adjust appropriately 2. Type 2 diabetes: Hold oral agents Sliding scale coverage 3. Essential hypertension restart home medications will hold lisinopril in the perioperative period can restart after arteriogram 4. Acute on Chronic diastolic congestive heart failure continue with diuresis, follow renal function    All the records are reviewed and case discussed with Care Management/Social Workerr. Management plans discussed with the patient, family and they are in agreement.  CODE STATUS: full TOTAL TIME TAKING CARE OF THIS PATIENT: 33 minutes.   POSSIBLE D/C IN 2-3DAYS, DEPENDING ON CLINICAL CONDITION.   Jesus Alvarado,  Jesus [email or web address].D on 01/07/2016 at 10:48 AM  Between 7am to 6pm - Pager - [phone]  After 6pm: House Pager: - [phone]  [facility]  Office  [phone]  CC: Primary care physician; PROVIDER NOT IN SYSTEM

## 2016-01-07 NOTE — Interval H&P Note (Signed)
History and Physical Interval Note:  01/07/2016 4:02 PM  Jesus Alvarado  has presented today for surgery, with the diagnosis of N/A  The various methods of treatment have been discussed with the patient and family. After consideration of risks, benefits and other options for treatment, the patient has consented to  Procedure(s): IRRIGATION AND DEBRIDEMENT FOOT (Left) as a surgical intervention .  The patient's history has been reviewed, patient examined, no change in status, stable for surgery.  I have reviewed the patient's chart and labs.  Questions were answered to the patient's satisfaction.     Ricci Barkerodd W Chantz Montefusco

## 2016-01-07 NOTE — Consult Note (Signed)
[facility] Vascular Consult Note  MRN : [id number]  Jesus Alvarado is a 75 y.o. (06/28/1940) male who presents with chief complaint of No chief complaint on file. Marland Kitchen.  History of Present Illness: I am asked by Dr. Alberteen Spindleline to see the patient regarding his left lower extremity perfusion for wound healing. He has a long-standing wound on the left foot with infection and osteomyelitis. He is scheduled for surgery to debride the foot today. He is likely to have a significant bone resection as well. This does not have a lot of pain. He has pretty significant neuropathy would appear. He reports having had an ulceration on this foot for 10 years or so. He's had pressure ulcers on his heel as well as satellite ulcers from irritation of the skin. He's had local wound care including evaluation at the wound care center for over a decade. He reports recent fever and chills but no worsening pain. He has chronic lower extremity swelling that may be slightly worse recently. He denies any significant improvement despite aggressive local wound care. He is quite immobile and it is difficult to offload the foot. There was no clear inciting event or causative factor started the ulcer other than possibly pressure many years ago.  Current Facility-Administered Medications  Medication Dose Route Frequency Provider Last Rate Last Dose  . 0.9 %  sodium chloride infusion  250 mL Intravenous PRN Auburn BilberryShreyang Patel, MD      . acetaminophen (TYLENOL) tablet 650 mg  650 mg Oral Q6H PRN Auburn BilberryShreyang Patel, MD       Or  . acetaminophen (TYLENOL) suppository 650 mg  650 mg Rectal Q6H PRN Auburn BilberryShreyang Patel, MD      . Ampicillin-Sulbactam (UNASYN) 3 g in sodium chloride 0.9 % 100 mL IVPB  3 g Intravenous Q6H Auburn BilberryShreyang Patel, MD   3 g at 01/07/16 0414  . dextrose 5 %-0.45 % sodium chloride infusion   Intravenous Continuous Auburn BilberryShreyang Patel, MD 50 mL/hr at 01/06/16 1605    . enoxaparin (LOVENOX) injection 40 mg  40 mg  Subcutaneous BID Auburn BilberryShreyang Patel, MD   40 mg at 01/06/16 2053  . hydrALAZINE (APRESOLINE) injection 10 mg  10 mg Intravenous Q6H PRN Auburn BilberryShreyang Patel, MD   10 mg at 01/06/16 1603  . HYDROcodone-acetaminophen (NORCO/VICODIN) 5-325 MG per tablet 1-2 tablet  1-2 tablet Oral Q4H PRN Auburn BilberryShreyang Patel, MD      . insulin aspart (novoLOG) injection 0-9 Units  0-9 Units Subcutaneous TID WC Auburn BilberryShreyang Patel, MD   1 Units at 01/07/16 [id number]  . labetalol (NORMODYNE,TRANDATE) injection 10 mg  10 mg Intravenous Q4H PRN Katharina Caperima Vaickute, MD   10 mg at 01/07/16 0423  . ondansetron (ZOFRAN) tablet 4 mg  4 mg Oral Q6H PRN Auburn BilberryShreyang Patel, MD       Or  . ondansetron (ZOFRAN) injection 4 mg  4 mg Intravenous Q6H PRN Auburn BilberryShreyang Patel, MD      . sodium chloride flush (NS) 0.9 % injection 3 mL  3 mL Intravenous Q12H Auburn BilberryShreyang Patel, MD   3 mL at 01/06/16 2200  . sodium chloride flush (NS) 0.9 % injection 3 mL  3 mL Intravenous PRN Auburn BilberryShreyang Patel, MD      . vancomycin (VANCOCIN) 1,500 mg in sodium chloride 0.9 % 500 mL IVPB  1,500 mg Intravenous Q24H Auburn BilberryShreyang Patel, MD   1,500 mg at 01/07/16 0414    Past Medical History:  Diagnosis Date  . Barrett's esophagus   . DDD (  degenerative disc disease), lumbar   . DDD (degenerative disc disease), lumbar   . Diabetes mellitus without complication (HCC)   . Gastric ulcer   . Glaucoma   . Hard of hearing   . History of spinal stenosis   . Hyperlipemia   . Hypertension   . Hypertension   . Nephrolithiasis   . Neuropathy (HCC)   . Peripheral neuropathy (HCC)   . Sarcoid [facility])     Past Surgical History:  Procedure Laterality Date  . APPENDECTOMY    . BACK SURGERY    . carpel tunnel    . CATARACT EXTRACTION    . LYMPH NODE DISSECTION    . NECK SURGERY    . ORIF ANKLE FRACTURE      Social History Social History  Substance Use Topics  . Smoking status: Never Smoker  . Smokeless tobacco: Never Used  . Alcohol use No  No IV drug use  Family History Family History  Problem  Relation Age of Onset  . Alcohol abuse Father   No bleeding disorders, clotting disorders, autoimmune diseases, or aneurysms that are known  Allergies  Allergen Reactions  . Terazosin Hcl Other (See Comments)    Reaction:  Unknown      REVIEW OF SYSTEMS (Negative unless checked)  Constitutional: [] Weight loss  [] Fever  [] Chills Cardiac: [] Chest pain   [] Chest pressure   [] Palpitations   [] Shortness of breath when laying flat   [] Shortness of breath at rest   [] Shortness of breath with exertion. Vascular:  [] Pain in legs with walking   [] Pain in legs at rest   [] Pain in legs when laying flat   [] Claudication   [] Pain in feet when walking  [] Pain in feet at rest  [] Pain in feet when laying flat   [] History of DVT   [] Phlebitis   [x] Swelling in legs   [] Varicose veins   [x] Non-healing ulcers Pulmonary:   [] Uses home oxygen   [] Productive cough   [] Hemoptysis   [] Wheeze  [] COPD   [] Asthma Neurologic:  [] Dizziness  [] Blackouts   [] Seizures   [] History of stroke   [] History of TIA  [] Aphasia   [] Temporary blindness   [] Dysphagia   [x] Weakness or numbness in arms   [x] Weakness or numbness in legs Musculoskeletal:  [x] Arthritis   [] Joint swelling   [] Joint pain   [] Low back pain Hematologic:  [] Easy bruising  [] Easy bleeding   [] Hypercoagulable state   [] Anemic  [] Hepatitis Gastrointestinal:  [] Blood in stool   [] Vomiting blood  [] Gastroesophageal reflux/heartburn   [] Difficulty swallowing. Genitourinary:  [] Chronic kidney disease   [] Difficult urination  [] Frequent urination  [] Burning with urination   [] Blood in urine Skin:  [] Rashes   [x] Ulcers   [x] Wounds Psychological:  [] History of anxiety   []  History of major depression.  Physical Examination  Vitals:   01/06/16 2149 01/07/16 0419 01/07/16 0428 01/07/16 0452  BP:  (!) 220/84 (!) 182/84 (!) 162/86  Pulse:  72    Resp:      Temp:  97.5 F (36.4 C)    TempSrc:  Oral    SpO2: 96% 97%    Weight:      Height:       Body mass index is  41.23 kg/m. Gen:  WD/WN, NAD. obese Head: [place]/AT, No temporalis wasting. Prominent temp pulse not noted. Ear/Nose/Throat: Hearing grossly intact, nares w/o erythema or drainage, oropharynx w/o Erythema/Exudate Eyes: Sclera non-icteric, conjunctiva clear Neck: Trachea midline.  No JVD.  Pulmonary:  Good air  movement, respirations not labored, equal bilaterally.  Cardiac: RRR, normal S1, S2. Vascular:  Vessel Right Left  Radial Palpable Palpable  Ulnar Palpable Palpable  Brachial Palpable Palpable  Carotid Palpable, without bruit Palpable, without bruit  Aorta Not palpable N/A  Femoral Palpable Palpable  Popliteal Not Palpable Not Palpable  PT 1+ Palpable Not Palpable  DP Trace Palpable Trace Palpable   Gastrointestinal: soft, non-tender/non-distended. No guarding/reflex.  Musculoskeletal: contractures of the wrist bilaterally.  1-2+ LLE edema. 1+ RLE edema.  Neurologic: Sensation diminished in the extremities.  Symmetrical.  Speech is fluent.  Psychiatric: Judgment intact, Mood & affect appropriate for pt's clinical situation. Dermatologic: open ulceration on the left fifth metatarsal head with surrounding erythema and drainage. Lymph : No Cervical, Axillary, or Inguinal lymphadenopathy.      CBC Lab Results  Component Value Date   WBC 10.6 01/07/2016   HGB 12.7 (L) 01/07/2016   HCT 38.5 (L) 01/07/2016   MCV 90.7 01/07/2016   PLT 208 01/07/2016    BMET    Component Value Date/Time   NA 142 01/07/2016 0456   NA 141 07/19/2012 1304   K 3.7 01/07/2016 0456   K 4.1 03/04/2013 1253   CL 106 01/07/2016 0456   CL 106 07/19/2012 1304   CO2 27 01/07/2016 0456   CO2 27 07/19/2012 1304   GLUCOSE 105 (H) 01/07/2016 0456   GLUCOSE 150 (H) 07/19/2012 1304   BUN 43 (H) 01/07/2016 0456   BUN 23 (H) 07/19/2012 1304   CREATININE 1.94 (H) 01/07/2016 0456   CREATININE 1.26 07/19/2012 1304   CALCIUM 8.4 (L) 01/07/2016 0456   CALCIUM 8.1 (L) 07/19/2012 1304   GFRNONAA 33 (L)  01/07/2016 0456   GFRNONAA 58 (L) 07/19/2012 1304   GFRAA 38 (L) 01/07/2016 0456   GFRAA >60 07/19/2012 1304   Estimated Creatinine Clearance: 51.6 mL/min (by C-G formula based on SCr of 1.94 mg/dL (H)).  COAG Lab Results  Component Value Date   INR 1.08 06/17/2015   INR 0.9 09/12/2007   INR 0.9 07/12/2007    Radiology No results found.    Assessment/Plan 1. PAD with ulceration left lower extremity. This showed significant infection and osteomyelitis. He is scheduled for a wound debridement today. His risk of limb loss is very high. This is a chronic wound and he does not have good palpable pedal pulses so I suspect significant peripheral arterial disease is present. At our institution, arterial duplex is not available and with the high likelihood of disease I would recommend proceeding directly with angiography with revascularization. This has been scheduled for tomorrow. Risks and benefits were discussed with the patient. 2. Diabetes mellitus. Likely an underlying cause abdominal his ulceration but significant vascular disease. Glucose control important in improving wound healing and maintenance of arterial patency. 3. Renal insufficiency. Aggressive hydration around the time of the procedure. We will limit contrast as much as possible during the angiogram. 4. HTN. Stable on outpatient medications.   Festus Barren, MD  01/07/2016 8:59 AM    This note was created with Dragon medical transcription system.  Any error is purely unintentional

## 2016-01-07 NOTE — Progress Notes (Signed)
Pt's PTA med list has not been verified by Pharmacy, currently awaiting a fax from the [facility]. Spoke to Nate in the Pharmacy about verifying home medications as patient is not knowledgeable of exactly what he takes or the dosages and was not expected to be admitted today. PRN BP labetelol ordered to keep BP under control until Pharmacist is available and has spoken to the [facility] in the AM to verify medications and rounding doctor can input home medications. Adelina MingsKim Bruin Bolger RN-BC

## 2016-01-07 NOTE — Transfer of Care (Signed)
Immediate Anesthesia Transfer of Care Note  Patient: Jesus BundeLeighton L Sinagra  Procedure(s) Performed: Procedure(s): IRRIGATION AND DEBRIDEMENT FOOT (Left)  Patient Location: PACU  Anesthesia Type:General  Level of Consciousness: awake  Airway & Oxygen Therapy: Patient Spontanous Breathing and Patient connected to face mask oxygen  Post-op Assessment: Report given to RN and Post -op Vital signs reviewed and stable  Post vital signs: Reviewed  Last Vitals:  Vitals:   01/07/16 1240 01/07/16 1733  BP: (!) 178/89 (!) 149/77  Pulse: 76 83  Resp: 20 20  Temp: 36.7 C (!) 36 C    Last Pain:  Vitals:   01/07/16 1240  TempSrc: Oral  PainSc:          Complications: No apparent anesthesia complications

## 2016-01-07 NOTE — Anesthesia Postprocedure Evaluation (Signed)
Anesthesia Post Note  Patient: Jesus Alvarado  Procedure(s) Performed: Procedure(s) (LRB): IRRIGATION AND DEBRIDEMENT FOOT (Left)  Patient location during evaluation: PACU Anesthesia Type: General Level of consciousness: awake and alert Pain management: pain level controlled Vital Signs Assessment: post-procedure vital signs reviewed and stable Respiratory status: spontaneous breathing, nonlabored ventilation, respiratory function stable and patient connected to nasal cannula oxygen Cardiovascular status: blood pressure returned to baseline and stable Postop Assessment: no signs of nausea or vomiting Anesthetic complications: no    Last Vitals:  Vitals:   01/07/16 1915 01/07/16 2039  BP: (!) 155/76 133/69  Pulse: (!) 59 73  Resp: 20 18  Temp: 36.7 C 36.7 C    Last Pain:  Vitals:   01/07/16 2039  TempSrc: Oral  PainSc:                  [name]

## 2016-01-07 NOTE — Anesthesia Procedure Notes (Signed)
Procedure Name: LMA Insertion Performed by: Tanaia Hawkey Pre-anesthesia Checklist: Patient identified, Patient being monitored, Timeout performed, Emergency Drugs available and Suction available Patient Re-evaluated:Patient Re-evaluated prior to inductionOxygen Delivery Method: Circle system utilized Preoxygenation: Pre-oxygenation with 100% oxygen Intubation Type: IV induction Ventilation: Mask ventilation without difficulty LMA: LMA inserted LMA Size: 4.5 Tube type: Oral Number of attempts: 1 Placement Confirmation: positive ETCO2 and breath sounds checked- equal and bilateral Tube secured with: Tape Dental Injury: Teeth and Oropharynx as per pre-operative assessment      

## 2016-01-08 ENCOUNTER — Encounter: Payer: Self-pay | Admitting: Podiatry

## 2016-01-08 ENCOUNTER — Encounter
Admission: AD | Disposition: A | Discharge status: Home Health | Payer: Self-pay | Source: Ambulatory Visit | Attending: Internal Medicine

## 2016-01-08 DIAGNOSIS — I70248 Atherosclerosis of native arteries of left leg with ulceration of other part of lower left leg: Secondary | ICD-10-CM

## 2016-01-08 HISTORY — PX: PERIPHERAL VASCULAR CATHETERIZATION: SHX172C

## 2016-01-08 LAB — BASIC METABOLIC PANEL
ANION GAP: 8 (ref 5–15)
BUN: 42 mg/dL — ABNORMAL HIGH (ref 6–20)
CALCIUM: 8.2 mg/dL — AB (ref 8.9–10.3)
CO2: 28 mmol/L (ref 22–32)
CREATININE: 2.11 mg/dL — AB (ref 0.61–1.24)
Chloride: 106 mmol/L (ref 101–111)
GFR, EST AFRICAN AMERICAN: 34 mL/min — AB (ref >60)
GFR, EST NON AFRICAN AMERICAN: 29 mL/min — AB (ref >60)
GLUCOSE: 114 mg/dL — AB (ref 65–99)
Potassium: 4 mmol/L (ref 3.5–5.1)
Sodium: 142 mmol/L (ref 135–145)

## 2016-01-08 LAB — CBC
HCT: 37.1 % — ABNORMAL LOW (ref 40.0–52.0)
HEMOGLOBIN: 12 g/dL — AB (ref 13.0–18.0)
MCH: 29.4 pg (ref 26.0–34.0)
MCHC: 32.4 g/dL (ref 32.0–36.0)
MCV: 90.9 fL (ref 80.0–100.0)
PLATELETS: 256 10*3/uL (ref 150–440)
RBC: 4.08 MIL/uL — AB (ref 4.40–5.90)
RDW: 14.9 % — ABNORMAL HIGH (ref 11.5–14.5)
WBC: 12.8 10*3/uL — ABNORMAL HIGH (ref 3.8–10.6)

## 2016-01-08 LAB — GLUCOSE, CAPILLARY
GLUCOSE-CAPILLARY: 88 mg/dL (ref 65–99)
GLUCOSE-CAPILLARY: 167 mg/dL — AB (ref 65–99)
GLUCOSE-CAPILLARY: 206 mg/dL — AB (ref 65–99)

## 2016-01-08 LAB — SEDIMENTATION RATE: SED RATE: 88 mm/h — AB (ref 0–20)

## 2016-01-08 LAB — C-REACTIVE PROTEIN: CRP: 14.6 mg/dL — ABNORMAL HIGH (ref <1.0)

## 2016-01-08 SURGERY — LOWER EXTREMITY ANGIOGRAPHY
Anesthesia: Moderate Sedation | Site: Leg Lower | Laterality: Right

## 2016-01-08 SURGERY — LOWER EXTREMITY ANGIOGRAPHY
Anesthesia: Moderate Sedation | Laterality: Left

## 2016-01-08 MED ORDER — MIDAZOLAM HCL 2 MG/2ML IJ SOLN
INTRAMUSCULAR | Status: DC | PRN
  Administered 2016-01-08 (×2): 1 mg via INTRAVENOUS

## 2016-01-08 MED ORDER — FENTANYL CITRATE (PF) 100 MCG/2ML IJ SOLN
INTRAMUSCULAR | Status: DC | PRN
  Administered 2016-01-08 (×2): 25 ug via INTRAVENOUS

## 2016-01-08 MED ORDER — HEPARIN SODIUM (PORCINE) 1000 UNIT/ML IJ SOLN
INTRAMUSCULAR | Status: AC
  Filled 2016-01-08: qty 1

## 2016-01-08 MED ORDER — FENTANYL CITRATE (PF) 100 MCG/2ML IJ SOLN
INTRAMUSCULAR | Status: AC
  Filled 2016-01-08: qty 2

## 2016-01-08 MED ORDER — MIDAZOLAM HCL 5 MG/5ML IJ SOLN
INTRAMUSCULAR | Status: AC
  Filled 2016-01-08: qty 5

## 2016-01-08 MED ORDER — HEPARIN SODIUM (PORCINE) 1000 UNIT/ML IJ SOLN
INTRAMUSCULAR | Status: DC | PRN
  Administered 2016-01-08: 4000 [IU] via INTRAVENOUS

## 2016-01-08 MED ORDER — LIDOCAINE-EPINEPHRINE (PF) 1 %-1:200000 IJ SOLN
INTRAMUSCULAR | Status: AC
  Filled 2016-01-08: qty 30

## 2016-01-08 MED ORDER — PIPERACILLIN-TAZOBACTAM 4.5 G IVPB
4.5000 g | Freq: Three times a day (TID) | INTRAVENOUS | Status: DC
  Administered 2016-01-08 – 2016-01-10 (×6): 4.5 g via INTRAVENOUS
  Filled 2016-01-08 (×8): qty 100

## 2016-01-08 MED ORDER — SODIUM CHLORIDE 0.9 % IV SOLN
INTRAVENOUS | Status: DC
Start: 2016-01-08 — End: 2016-01-08

## 2016-01-08 SURGICAL SUPPLY — 15 items
BALLN ULTRVRSE 3X150X150 (BALLOONS) ×3
BALLOON ULTRVRSE 3X150X150 (BALLOONS) ×1 IMPLANT
CATH CXI SUPP ANG 4FR 135 (MICROCATHETER) ×1 IMPLANT
CATH CXI SUPP ANG 4FR 135CM (MICROCATHETER) ×3
CATH PIG 70CM (CATHETERS) ×3 IMPLANT
DEVICE PRESTO INFLATION (MISCELLANEOUS) ×3 IMPLANT
DEVICE STARCLOSE SE CLOSURE (Vascular Products) ×3 IMPLANT
GLIDEWIRE ADV .035X260CM (WIRE) ×3 IMPLANT
PACK ANGIOGRAPHY (CUSTOM PROCEDURE TRAY) ×3 IMPLANT
SHEATH BRITE TIP 5FRX11 (SHEATH) ×3 IMPLANT
SHEATH RAABE 6FRX70 (SHEATH) ×3 IMPLANT
SYR MEDRAD MARK V 150ML (SYRINGE) ×3 IMPLANT
TUBING CONTRAST HIGH PRESS 72 (TUBING) ×3 IMPLANT
WIRE G V18X300CM (WIRE) ×3 IMPLANT
WIRE J 3MM .035X145CM (WIRE) ×3 IMPLANT

## 2016-01-08 NOTE — Progress Notes (Signed)
Pharmacy Antibiotic Note  Jesus Alvarado is a 75 y.o. male admitted on 01/06/2016 with wound infection .  Pharmacy has been consulted for Zosyn  dosing.  Plan:  Patient currently on Vancomycin therapy and Unasyn therapy. Unasyn discontinued by MD Sampson GoonFitzgerald.   Will start Zosyn 4.5 g IV q8 hours.    Height: 6\' 2"  (188 cm) Weight: (!) 321 lb 1.6 oz (145.7 kg) IBW/kg (Calculated) : 82.2  Temp (24hrs), Avg:98 F (36.7 C), Min:96.8 F (36 C), Max:98.4 F (36.9 C)   Recent Labs Lab 01/06/16 1254 01/07/16 0456 01/08/16 0445  WBC 9.7 10.6 12.8*  CREATININE 2.37* 1.94* 2.11*    Estimated Creatinine Clearance: 47.5 mL/min (by C-G formula based on SCr of 2.11 mg/dL (H)).    Allergies  Allergen Reactions  . Terazosin Hcl Other (See Comments)    Reaction:  Unknown     Antimicrobials this admission: Unasyn  10/25 >> 10/27 Vancomycin  10/25 >>   Dose adjustments this admission:   Microbiology results:  BCx:   UCx:   Sputum:    MRSA PCR:   Thank you for allowing pharmacy to be a part of this patient's care.  Temica Righetti D 01/08/2016 3:01 PM

## 2016-01-08 NOTE — Progress Notes (Signed)
Pt clinically stable post angiogram, vitals remain stable, pt awake, alert and oriented, Dr Wyn Quakerew out to speak with patieint with questions answered, sr per monitor, no bleeding nor hematoma noted at groin site. Report called to care nurse on 220/2c, with plan reviewed,

## 2016-01-08 NOTE — Progress Notes (Signed)
Notified Dr. Clint GuyHower that patient had a culture of the bone of the metatarsal that came back with gram - rods. No new orders received pt is on vancymycin and zosyn. Okay per MD to discontinue foley catheter as it was placed in a procedure

## 2016-01-08 NOTE — Progress Notes (Signed)
Once pt was put on the bed in the vascular bed CPAP mask placed on patient at this time with 3lpm of oxygen bled in.  Saturations 91% at this time and pt tolerating well. Patient nurse to call if further assistance needed by respiratory therapy

## 2016-01-08 NOTE — OR Nursing (Signed)
7:20 Dr. Wyn Quakerew called and notified of patients resp distress and use of abd. Muscles to breath. Sats 91% on 3l/m. Dr. Wyn Quakerew ordered CPAP and resp. Notified.

## 2016-01-08 NOTE — Op Note (Signed)
[name] Percutaneous Study/Intervention Procedural Note   Date of Surgery: 01/08/2016  Surgeon(s):DEW,JASON   Assistants:none  Pre-operative Diagnosis: PAD with ulceration left lower extremity with infection, chronic kidney disease stage IV, diabetes  Post-operative diagnosis: Same  Procedure(s) Performed: 1. Ultrasound guidance for vascular access right femoral artery 2. Catheter placement into left anterior tibial artery and left posterior tibial artery from right femoral approach 3. Aortogram and selective left lower extremity angiogram 4. Percutaneous transluminal angioplasty of left anterior tibial artery proximally with 3 mm diameter by 15 cm length angioplasty balloon 5. Percutaneous transluminal angioplasty of the left posterior tibial artery with separate inflations proximally and distally with a 3 mm diameter by 15 cm length angioplasty balloon  6. StarClose closure device right femoral artery  EBL: 10 cc  Contrast: 40 cc  Fluoro Time: 3.9 minutes  Moderate Conscious Sedation Time: approximately 25 minutes using 2 mg of Versed and 50 mcg of Fentanyl  Indications: Patient is a 75 y.o.male with osteomyelitis and infection of the left foot ulceration. The patient is brought in for angiography for further evaluation and potential treatment. Risks and benefits are discussed and informed consent is obtained  Procedure: The patient was identified and appropriate procedural time out was performed. The patient was then placed supine on the table and prepped and draped in the usual sterile fashion.Moderate conscious sedation was administered during a face to face encounter with the patient throughout the procedure with my supervision of the RN administering medicines and monitoring the patient's vital signs, pulse oximetry, telemetry and mental status throughout from the  start of the procedure until the patient was taken to the recovery room. Ultrasound was used to evaluate the right common femoral artery. It was patent . A digital ultrasound image was acquired. A Seldinger needle was used to access the right common femoral artery under direct ultrasound guidance and a permanent image was performed. A 0.035 J wire was advanced without resistance and a 5Fr sheath was placed. Pigtail catheter was placed into the aorta and an AP aortogram was performed. This demonstrated normal renal arteries and normal aorta and iliac segments without significant stenosis. I then crossed the aortic bifurcation and advanced to the left femoral head. Selective left lower extremity angiogram was then performed. This demonstrated a normal common femoral artery, profunda femoris artery, and SFA with about 30% stenosis at Hunter's canal but otherwise no significant lesion. The popliteal artery was normal. He had a normal tibial trifurcation with about an 85-90% stenosis in the anterior tibial artery in its proximal portion good runoff distally. The posterior tibial artery had 2 areas of greater than 80% stenosis in the first 10-12 cm of the vessel and another area with a near occlusive stenosis in the distal posterior tibial artery about 5 cm above the ankle but the vessel was then continuous into the foot. The patient was systemically heparinized and a 6 French 70 cm sheath was then placed over the Terumo Advantage wire. I then used a Kumpe catheter and the advantage wire to navigate into the anterior tibial artery. I crossed the stenosis and exchanged for a CXI catheter in the mid anterior tibial artery and confirm intraluminal flow. I then replaced a 0.018V 18 wire the proximal anterior tibial artery stenosis with a 3 mm diameter by 15 cm length angioplasty balloon inflated to 12 atm for 1 minute. Angiogram following this intervention showed about a 20-25% residual stenosis at most in the anterior  tibial artery with brisk  flow distally. I then turned my attention to the posterior tibial artery. I was able to cross the stenosis in the proximal posterior tibial artery as well as the distal posterior tibial artery and parked the wire into the foot. 2 separate inflations with the 3 mm diameter by 15 cm length balloon were performed. The first inflation was in the proximal posterior tibial artery and was to 10 atm for 1 minute. The second inflation was in the distal posterior tibial artery just above the ankle and was to 12 atm for 1 minute. Completion angiogram showed about a 20% residual stenosis proximally and about a 25% residual stenosis distally but brisk flow into the foot. I elected to terminate the procedure. The sheath was removed and StarClose closure device was deployed in the right femoral artery with excellent hemostatic result. The patient was taken to the recovery room in stable condition having tolerated the procedure well.  Findings:  Aortogram: This demonstrated normal renal arteries and normal aorta and iliac segments without significant stenosis. Left Lower Extremity: This demonstrated a normal common femoral artery, profunda femoris artery, and SFA with about 30% stenosis at Hunter's canal but otherwise no significant lesion. The popliteal artery was normal. He had a normal tibial trifurcation with about an 85-90% stenosis in the anterior tibial artery in its proximal portion good runoff distally. The posterior tibial artery had 2 areas of greater than 80% stenosis in the first 10-12 cm of the vessel and another area with a near occlusive stenosis in the distal posterior tibial artery about 5 cm above the ankle but the vessel was then continuous into the foot   Disposition: Patient was taken to the recovery room in stable condition having tolerated the procedure well.  Complications: None  Jesus Alvarado 01/08/2016 9:15 AM   This note was created with  Dragon Medical transcription system. Any errors in dictation are purely unintentional.

## 2016-01-08 NOTE — Progress Notes (Signed)
Day of Surgery  Subjective: Patient seen. Denies any pain with the left foot. States his vascular procedure went well this morning.  Objective: Vital signs in last 24 hours: Temp:  [96.8 F (36 C)-98.4 F (36.9 C)] 98.2 F (36.8 C) (10/27 1142) Pulse Rate:  [59-83] 66 (10/27 1142) Resp:  [16-29] 22 (10/27 1142) BP: (133-186)/(66-95) 144/66 (10/27 1142) SpO2:  [86 %-100 %] 97 % (10/27 1142) Last BM Date: 01/05/16  Intake/Output from previous day: 10/26 0701 - 10/27 0700 In: 2446 [P.O.:180; I.V.:2266] Out: 1605 [Urine:1600; Blood:5] Intake/Output this shift: Total I/O In: 0  Out: 1000 [Urine:1000]  The bandage is dry and intact. Upon removal there is moderate bleeding from the plantar wound. Minimal purulence. Significant reduction in the erythema and edema in the left foot and leg. Vision dorsally as well coapted.  Lab Results:   Recent Labs  01/07/16 0456 01/08/16 0445  WBC 10.6 12.8*  HGB 12.7* 12.0*  HCT 38.5* 37.1*  PLT 208 256   BMET  Recent Labs  01/07/16 0456 01/08/16 0445  NA 142 142  K 3.7 4.0  CL 106 106  CO2 27 28  GLUCOSE 105* 114*  BUN 43* 42*  CREATININE 1.94* 2.11*  CALCIUM 8.4* 8.2*   PT/INR No results for input(s): LABPROT, INR in the last 72 hours. ABG No results for input(s): PHART, HCO3 in the last 72 hours.  Invalid input(s): PCO2, PO2  Studies/Results: No results found.  Anti-infectives: Anti-infectives    Start     Dose/Rate Route Frequency Ordered Stop   01/07/16 2200  vancomycin (VANCOCIN) 1,500 mg in sodium chloride 0.9 % 500 mL IVPB     1,500 mg 250 mL/hr over 120 Minutes Intravenous Every 18 hours 01/07/16 1350     01/07/16 1710  vancomycin (VANCOCIN) powder  Status:  Discontinued       As needed 01/07/16 1710 01/07/16 1729   01/07/16 0500  vancomycin (VANCOCIN) 1,500 mg in sodium chloride 0.9 % 500 mL IVPB  Status:  Discontinued     1,500 mg 250 mL/hr over 120 Minutes Intravenous Every 24 hours 01/06/16 1454 01/07/16  1350   01/06/16 1600  Ampicillin-Sulbactam (UNASYN) 3 g in sodium chloride 0.9 % 100 mL IVPB     3 g 200 mL/hr over 30 Minutes Intravenous Every 6 hours 01/06/16 1446     01/06/16 1600  vancomycin (VANCOCIN) 1,500 mg in sodium chloride 0.9 % 500 mL IVPB     1,500 mg 250 mL/hr over 120 Minutes Intravenous  Once 01/06/16 1451 01/06/16 2025      Assessment/Plan: s/p Procedure(s): Lower Extremity Angiography (Right) Assessment: Osteomyelitis left forefoot status post debridement area  Plan: Sterile dressing was reapplied to the surgical site on the left lower extremity. Patient has a wedge surgical shoe that was dispensed last evening that he was instructed on walking on the heel in the shoe. Discussed with the patient that he will need his bandage changed every 2-3 days for the first week and then we will adjusted after that depending on drainage. We'll plan on seeing the patient in approximately one week outpatient for follow-up  LOS: 2 days    Ricci Barkerodd [address] [place] 01/08/2016

## 2016-01-08 NOTE — Consult Note (Signed)
[facility]     Reason for Consult: Foot osteomyelitis   Referring Physician: Sharlotte Alamo Date of Admission:  01/06/2016   Active Problems:   Foot osteomyelitis, left (HCC)   HPI: Jesus Alvarado is a 75 y.o. male with a history of chronic L foot ulceration for over a month. She was admitted 10/25, with open ulceration on foot and xray evidence of osteomyelitis.  She had surgery 10/26 with debridement of L fifth MT and prox phalanx and placement of abx beads.  Culture from Bone is pending, MRSA nasal PCR negative.  She had vascular intervention 10/27 with angioplasty.    Past Medical History:  Diagnosis Date  . Barrett's esophagus   . DDD (degenerative disc disease), lumbar   . DDD (degenerative disc disease), lumbar   . Diabetes mellitus without complication ([place])   . Gastric ulcer   . Glaucoma   . Hard of hearing   . History of spinal stenosis   . Hyperlipemia   . Hypertension   . Hypertension   . Nephrolithiasis   . Neuropathy ([place])   . Peripheral neuropathy ([place])   . Sarcoid [facility])    Past Surgical History:  Procedure Laterality Date  . APPENDECTOMY    . BACK SURGERY    . carpel tunnel    . CATARACT EXTRACTION    . IRRIGATION AND DEBRIDEMENT FOOT Left 01/07/2016   Procedure: IRRIGATION AND DEBRIDEMENT FOOT;  Surgeon: Sharlotte Alamo, DPM;  Location: [facility] ORS;  Service: Podiatry;  Laterality: Left;  . LYMPH NODE DISSECTION    . NECK SURGERY    . ORIF ANKLE FRACTURE     Social History  Substance Use Topics  . Smoking status: Never Smoker  . Smokeless tobacco: Never Used  . Alcohol use No   Family History  Problem Relation Age of Onset  . Alcohol abuse Father     Allergies:  Allergies  Allergen Reactions  . Terazosin Hcl Other (See Comments)    Reaction:  Unknown     Current antibiotics: Antibiotics Given (last 72 hours)    Date/Time Action Medication Dose Rate   01/06/16 1658 Given   Ampicillin-Sulbactam (UNASYN) 3 g in sodium  chloride 0.9 % 100 mL IVPB 3 g 200 mL/hr   01/06/16 1825 Given   vancomycin (VANCOCIN) 1,500 mg in sodium chloride 0.9 % 500 mL IVPB 1,500 mg 250 mL/hr   01/06/16 2200 Given   Ampicillin-Sulbactam (UNASYN) 3 g in sodium chloride 0.9 % 100 mL IVPB 3 g 200 mL/hr   01/07/16 0414 Given   Ampicillin-Sulbactam (UNASYN) 3 g in sodium chloride 0.9 % 100 mL IVPB 3 g 200 mL/hr   01/07/16 0414 Given   vancomycin (VANCOCIN) 1,500 mg in sodium chloride 0.9 % 500 mL IVPB 1,500 mg 250 mL/hr   01/07/16 1117 Given   Ampicillin-Sulbactam (UNASYN) 3 g in sodium chloride 0.9 % 100 mL IVPB 3 g 200 mL/hr   01/07/16 1710 Given  [with stimulant beads]   vancomycin (VANCOCIN) powder 1 g    01/07/16 2238 Given   Ampicillin-Sulbactam (UNASYN) 3 g in sodium chloride 0.9 % 100 mL IVPB 3 g 200 mL/hr   01/07/16 2317 Given   vancomycin (VANCOCIN) 1,500 mg in sodium chloride 0.9 % 500 mL IVPB 1,500 mg 250 mL/hr   01/08/16 1139 Given   Ampicillin-Sulbactam (UNASYN) 3 g in sodium chloride 0.9 % 100 mL IVPB 3 g 200 mL/hr      MEDICATIONS: . amLODipine  5 mg  Oral Daily  . ampicillin-sulbactam (UNASYN) IV  3 g Intravenous Q6H  . aspirin EC  325 mg Oral Daily  . carvedilol  12.5 mg Oral BID  . cholecalciferol  5,000 Units Oral Daily  . enoxaparin (LOVENOX) injection  40 mg Subcutaneous BID  . gabapentin  300 mg Oral q morning - 10a  . gabapentin  500 mg Oral QHS  . insulin aspart  0-9 Units Subcutaneous TID WC  . insulin glargine  33 Units Subcutaneous QHS  . senna-docusate  1 tablet Oral QHS  . simvastatin  10 mg Oral QHS  . sodium chloride flush  3 mL Intravenous Q12H  . sucralfate  1 g Oral BID  . tamsulosin  0.4 mg Oral QPM  . vancomycin  1,500 mg Intravenous Q18H  . ascorbic acid  500 mg Oral BID    Review of Systems - 11 systems reviewed and negative per HPI   OBJECTIVE: Temp:  [96.8 F (36 C)-98.4 F (36.9 C)] 98.2 F (36.8 C) (10/27 1142) Pulse Rate:  [59-83] 66 (10/27 1142) Resp:  [16-29] 22  (10/27 1142) BP: (133-186)/(66-95) 144/66 (10/27 1142) SpO2:  [86 %-100 %] 97 % (10/27 1142) Physical Exam  Constitutional: He is oriented to person, place, and time. He appears well-developed and well-nourished. No distress.  HENT: anicteric Mouth/Throat: Oropharynx is clear and dry. No oropharyngeal exudate.  Cardiovascular: Normal rate, regular rhythm and normal heart sounds.  Pulmonary/Chest: Effort normal and breath sounds normal. No respiratory distress. He has no wheezes.  Abdominal: Soft. Bowel sounds are normal. He exhibits no distension. There is no tenderness.  Lymphadenopathy: He has no cervical adenopathy.  Neurological: He is alert and oriented to person, place, and time.d ecreased sensation bil le Ext 1+ edema bil Skin: L lateral foot with incisions in place, surgical site well coapted, min eryteman, no purulence.  Psychiatric: He has a normal mood and affect. His behavior is normal.     LABS: Results for orders placed or performed during the hospital encounter of 01/06/16 (from the past 48 hour(s))  Glucose, capillary     Status: Abnormal   Collection Time: 01/06/16  3:35 PM  Result Value Ref Range   Glucose-Capillary 55 (L) 65 - 99 mg/dL   Comment 1 Document in Chart   Glucose, capillary     Status: Abnormal   Collection Time: 01/06/16  3:57 PM  Result Value Ref Range   Glucose-Capillary 109 (H) 65 - 99 mg/dL  Glucose, capillary     Status: None   Collection Time: 01/06/16  4:39 PM  Result Value Ref Range   Glucose-Capillary 81 65 - 99 mg/dL   Comment 1 Notify RN   Glucose, capillary     Status: None   Collection Time: 01/06/16  5:50 PM  Result Value Ref Range   Glucose-Capillary 97 65 - 99 mg/dL   Comment 1 Notify RN   Glucose, capillary     Status: Abnormal   Collection Time: 01/06/16  9:40 PM  Result Value Ref Range   Glucose-Capillary 140 (H) 65 - 99 mg/dL  CBC     Status: Abnormal   Collection Time: 01/07/16  4:56 AM  Result Value Ref Range   WBC  10.6 3.8 - 10.6 K/uL   RBC 4.25 (L) 4.40 - 5.90 MIL/uL   Hemoglobin 12.7 (L) 13.0 - 18.0 g/dL   HCT 17.0 (L) 47.9 - 29.1 %   MCV 90.7 80.0 - 100.0 fL   MCH 29.9 26.0 - 34.0  pg   MCHC 33.0 32.0 - 36.0 g/dL   RDW 15.3 (H) 11.5 - 14.5 %   Platelets 208 150 - 440 K/uL  Basic metabolic panel     Status: Abnormal   Collection Time: 01/07/16  4:56 AM  Result Value Ref Range   Sodium 142 135 - 145 mmol/L   Potassium 3.7 3.5 - 5.1 mmol/L   Chloride 106 101 - 111 mmol/L   CO2 27 22 - 32 mmol/L   Glucose, Bld 105 (H) 65 - 99 mg/dL   BUN 43 (H) 6 - 20 mg/dL   Creatinine, Ser 1.94 (H) 0.61 - 1.24 mg/dL   Calcium 8.4 (L) 8.9 - 10.3 mg/dL   GFR calc non Af Amer 33 (L) >60 mL/min   GFR calc Af Amer 38 (L) >60 mL/min    Comment: (NOTE) The eGFR has been calculated using the CKD EPI equation. This calculation has not been validated in all clinical situations. eGFR's persistently <60 mL/min signify possible Chronic Kidney Disease.    Anion gap 9 5 - 15  Glucose, capillary     Status: Abnormal   Collection Time: 01/07/16  7:45 AM  Result Value Ref Range   Glucose-Capillary 125 (H) 65 - 99 mg/dL   Comment 1 Notify RN   Glucose, capillary     Status: Abnormal   Collection Time: 01/07/16 11:58 AM  Result Value Ref Range   Glucose-Capillary 168 (H) 65 - 99 mg/dL  Aerobic/Anaerobic Culture (surgical/deep wound)     Status: None (Preliminary result)   Collection Time: 01/07/16  5:05 PM  Result Value Ref Range   Specimen Description BONE 5TH METATARSAL    Special Requests PT ON UNASYN    Gram Stain      MODERATE WBC PRESENT,BOTH PMN AND MONONUCLEAR NO ORGANISMS SEEN Performed at [facility]    Culture PENDING    Report Status PENDING   Glucose, capillary     Status: Abnormal   Collection Time: 01/07/16  5:36 PM  Result Value Ref Range   Glucose-Capillary 116 (H) 65 - 99 mg/dL  Glucose, capillary     Status: Abnormal   Collection Time: 01/07/16  9:31 PM  Result Value Ref Range    Glucose-Capillary 124 (H) 65 - 99 mg/dL  Basic metabolic panel     Status: Abnormal   Collection Time: 01/08/16  4:45 AM  Result Value Ref Range   Sodium 142 135 - 145 mmol/L   Potassium 4.0 3.5 - 5.1 mmol/L   Chloride 106 101 - 111 mmol/L   CO2 28 22 - 32 mmol/L   Glucose, Bld 114 (H) 65 - 99 mg/dL   BUN 42 (H) 6 - 20 mg/dL   Creatinine, Ser 2.11 (H) 0.61 - 1.24 mg/dL   Calcium 8.2 (L) 8.9 - 10.3 mg/dL   GFR calc non Af Amer 29 (L) >60 mL/min   GFR calc Af Amer 34 (L) >60 mL/min    Comment: (NOTE) The eGFR has been calculated using the CKD EPI equation. This calculation has not been validated in all clinical situations. eGFR's persistently <60 mL/min signify possible Chronic Kidney Disease.    Anion gap 8 5 - 15  CBC     Status: Abnormal   Collection Time: 01/08/16  4:45 AM  Result Value Ref Range   WBC 12.8 (H) 3.8 - 10.6 K/uL   RBC 4.08 (L) 4.40 - 5.90 MIL/uL   Hemoglobin 12.0 (L) 13.0 - 18.0 g/dL   HCT 37.1 (  L) 40.0 - 52.0 %   MCV 90.9 80.0 - 100.0 fL   MCH 29.4 26.0 - 34.0 pg   MCHC 32.4 32.0 - 36.0 g/dL   RDW 14.9 (H) 11.5 - 14.5 %   Platelets 256 150 - 440 K/uL  Glucose, capillary     Status: None   Collection Time: 01/08/16 11:21 AM  Result Value Ref Range   Glucose-Capillary 88 65 - 99 mg/dL   No components found for: ESR, C REACTIVE PROTEIN MICRO: Recent Results (from the past 720 hour(s))  Surgical PCR screen     Status: None   Collection Time: 01/06/16  1:14 PM  Result Value Ref Range Status   MRSA, PCR NEGATIVE NEGATIVE Final   Staphylococcus aureus NEGATIVE NEGATIVE Final    Comment:        The Xpert SA Assay (FDA approved for NASAL specimens in patients over 67 years of age), is one component of a comprehensive surveillance program.  Test performance has been validated by [facility] for patients greater than or equal to 64 year old. It is not intended to diagnose infection nor to guide or monitor treatment.   Aerobic/Anaerobic Culture  (surgical/deep wound)     Status: None (Preliminary result)   Collection Time: 01/07/16  5:05 PM  Result Value Ref Range Status   Specimen Description BONE 5TH METATARSAL  Final   Special Requests PT ON UNASYN  Final   Gram Stain   Final    MODERATE WBC PRESENT,BOTH PMN AND MONONUCLEAR NO ORGANISMS SEEN Performed at [facility]    Culture PENDING  Incomplete   Report Status PENDING  Incomplete    IMAGING: No results found.  Assessment:   Jesus Alvarado is a 75 y.o. male with a history of chronic L foot ulceration for over a month. She was admitted 10/25, with open ulceration on foot and xray evidence of osteomyelitis.  She had surgery 10/26 with debridement of L fifth MT and prox phalanx and placement of abx beads.  Culture from Bone is pending, MRSA nasal PCR negative.  She had vascular intervention 10/27 with angioplasty.  Currently the wound looks very good, with no purulence and the surrounding infection is improved. I discussed the case with Dr Cleda Mccreedy and he reports the wound initially had some green drainage on removal of dressing. I do not think he currently needs a picc line and IV abx although I did discuss this possibility with him.  Recommendations Follow closely. Change unasyn to zosyn while inpatient for pseudomonal coverage Monitor culture but if negative can dc on empiric augmentin and cipro for a 14 day course from surgery.  I have check ESR and CRP  He can fu with podiatry next week If worsens I can see and place him on IV Abx  Thank you very much for allowing me to participate in the care of this patient. Please call with questions.   Cheral Marker. Ola Spurr, MD

## 2016-01-08 NOTE — Progress Notes (Addendum)
[facility] [place] at [facility]   PATIENT NAME: Jesus Alvarado    MRN#:  [id number]  DATE OF BIRTH:  1940-10-11  SUBJECTIVE:  Hospital Day: 2 days Jesus Alvarado is a 75 y.o. male presenting with Chronic foot wound.   Overnight events: No overnight events Interval Events: Angioplasty this morning continues to Complains of bilateral leg swelling-chronic otherwise no further complaints  REVIEW OF SYSTEMS:  CONSTITUTIONAL: No fever, fatigue or weakness.  EYES: No blurred or double vision.  EARS, NOSE, AND THROAT: No tinnitus or ear pain.  RESPIRATORY: No cough, shortness of breath, wheezing or hemoptysis.  CARDIOVASCULAR: No chest pain, orthopnea, Positive edema.  GASTROINTESTINAL: No nausea, vomiting, diarrhea or abdominal pain.  GENITOURINARY: No dysuria, hematuria.  ENDOCRINE: No polyuria, nocturia,  HEMATOLOGY: No anemia, easy bruising or bleeding SKIN: Positive ulcers on left foot otherwise No rash or lesion. MUSCULOSKELETAL: No joint pain or arthritis.   NEUROLOGIC: No tingling, numbness, weakness.  PSYCHIATRY: No anxiety or depression.   DRUG ALLERGIES:   Allergies  Allergen Reactions  . Terazosin Hcl Other (See Comments)    Reaction:  Unknown     VITALS:  Blood pressure (!) 155/71, pulse 66, temperature 98.4 F (36.9 C), temperature source Oral, resp. rate 20, height  (1.88 m), weight (!) 145.7 kg (321 lb 1.6 oz), SpO2 93 %.  PHYSICAL EXAMINATION:  VITAL SIGNS: Vitals:   01/08/16 1030 01/08/16 1055  BP: (!) 150/75 (!) 155/71  Pulse: 66 66  Resp: 19 20  Temp:  98.4 F (36.9 C)   GENERAL:75 y.o.male currently in no acute distress.  HEAD: Normocephalic, atraumatic.  EYES: Pupils equal, round, reactive to light. Extraocular muscles intact. No scleral icterus.  MOUTH: Moist mucosal membrane. Dentition intact. No abscess noted.  EAR, NOSE, THROAT: Clear without exudates. No external lesions.  NECK: Supple. No thyromegaly. No nodules.  No JVD.  PULMONARY: Clear to ascultation, without wheeze rails or rhonci. No use of accessory muscles, Good respiratory effort. good air entry bilaterally CHEST: Nontender to palpation.  CARDIOVASCULAR: S1 and S2. Regular rate and rhythm. No murmurs, rubs, or gallops. 2-3+ edema. Pedal pulses 2+ bilaterally.  GASTROINTESTINAL: Soft, nontender, nondistended. No masses. Positive bowel sounds. No hepatosplenomegaly.  MUSCULOSKELETAL: No swelling, clubbing, or edema. Range of motion full in all extremities.  NEUROLOGIC: Cranial nerves II through XII are intact. No gross focal neurological deficits. Sensation intact. Reflexes intact.  SKIN: Ulceration plantar aspect of foot base of great toe, fifth metatarsal head ulceration otherwise No further ulceration, lesions, rashes, or cyanosis. Skin warm and dry. Turgor intact.  PSYCHIATRIC: Mood, affect within normal limits. The patient is awake, alert and oriented x 3. Insight, judgment intact.      LABORATORY PANEL:   CBC  Recent Labs Lab 01/08/16 0445  WBC 12.8*  HGB 12.0*  HCT 37.1*  PLT 256   ------------------------------------------------------------------------------------------------------------------  Chemistries   Recent Labs Lab 01/06/16 1254  01/08/16 0445  NA 142  < > 142  K 3.8  < > 4.0  CL 108  < > 106  CO2 26  < > 28  GLUCOSE 82  < > 114*  BUN 54*  < > 42*  CREATININE 2.37*  < > 2.11*  CALCIUM 8.4*  < > 8.2*  AST 18  --   --   ALT 20  --   --   ALKPHOS 45  --   --   BILITOT 0.6  --   --   < > =  values in this interval not displayed. ------------------------------------------------------------------------------------------------------------------  Cardiac Enzymes No results for input(s): TROPONINI in the last 168 hours. ------------------------------------------------------------------------------------------------------------------  RADIOLOGY:  No results found.  EKG:   Orders placed or performed during  the hospital encounter of 11/09/15  . ED EKG within 10 minutes  . ED EKG within 10 minutes  . EKG 12-Lead  . EKG 12-Lead  . EKG    ASSESSMENT AND PLAN:   Jesus Alvarado is a 75 y.o. male presenting with No chief complaint on file. . Admitted 01/06/2016 : Day #: 2 days 1. Osteomyelitis left foot-podiatry input and vascular input appreciated  Debridement infected bone left fifth metatarsal and proximal phalanx. Status post angioplasty Await culture 2. Type 2 diabetes: Hold oral agents Sliding scale coverage 3. Essential hypertension home medications will hold lisinopril given renal function and dye load 4. Acute on Chronic diastolic congestive heart failure: hold further lasix, follow renal function    All the records are reviewed and case discussed with Care Management/Social Workerr. Management plans discussed with the patient, family and they are in agreement.  CODE STATUS: full TOTAL TIME TAKING CARE OF THIS PATIENT: 33 minutes.   POSSIBLE D/C IN 2-3DAYS, DEPENDING ON CLINICAL CONDITION.   Jesus Alvarado,  Jesus [email or web address].D on 01/08/2016 at 11:14 AM  Between 7am to 6pm - Pager - [phone]  After 6pm: House Pager: - [phone]  [facility]  Office  [phone]  CC: Primary care physician; PROVIDER NOT IN SYSTEM

## 2016-01-08 NOTE — Progress Notes (Signed)
Pharmacy Antibiotic Note  Jesus Alvarado is a 75 y.o. male admitted on 01/06/2016 with cellulitis/Osteomyeltitis.  Pharmacy has been consulted for Vancomycin and Unasyn dosing.  Plan: S/p I&D and toe amputation 10/26, angiogram this AM.   Continue vancomycin 1500mg  IV Q18H to target trough of 15-1620mcg/ml  Trough prior to 4th dose, which should be steady state.  Obese patient at risk for drug accumulation. Follow renal function closely. Creatinine up slightly this morning, will continue to monitor.   Continue Unasyn 3gm IV Q6H   Height: 6\' 2"  (188 cm) Weight: (!) 321 lb 1.6 oz (145.7 kg) IBW/kg (Calculated) : 82.2  Adj BW= 107.6 kg  Temp (24hrs), Avg:97.9 F (36.6 C), Min:96.8 F (36 C), Max:98.4 F (36.9 C)   Recent Labs Lab 01/06/16 1254 01/07/16 0456 01/08/16 0445  WBC 9.7 10.6 12.8*  CREATININE 2.37* 1.94* 2.11*    Estimated Creatinine Clearance: 47.5 mL/min (by C-G formula based on SCr of 2.11 mg/dL (H)).    Allergies  Allergen Reactions  . Terazosin Hcl Other (See Comments)    Reaction:  Unknown     Antimicrobials this admission: Unasyn 10/25 >>   Vanc 10/25 >>    Dose adjustments this admission:    Microbiology results:   BCx:     UCx:      Sputum:    10/25 MRSA PCR: neg 10/26 Bone: pend  Thank you for allowing pharmacy to be a part of this patient's care.  Jalyssa Fleisher C 01/08/2016 10:41 AM

## 2016-01-08 NOTE — H&P (Signed)
[facility] History & Physical Update  The patient was interviewed and re-examined.  The patient's previous History and Physical has been reviewed and is unchanged.  There is no change in the plan of care. We plan to proceed with the scheduled procedure.  Festus BarrenJason Dew, MD  01/08/2016, 8:37 AM

## 2016-01-09 ENCOUNTER — Inpatient Hospital Stay: Payer: Non-veteran care

## 2016-01-09 LAB — BASIC METABOLIC PANEL
ANION GAP: 8 (ref 5–15)
BUN: 47 mg/dL — ABNORMAL HIGH (ref 6–20)
CO2: 26 mmol/L (ref 22–32)
Calcium: 7.7 mg/dL — ABNORMAL LOW (ref 8.9–10.3)
Chloride: 103 mmol/L (ref 101–111)
Creatinine, Ser: 2.46 mg/dL — ABNORMAL HIGH (ref 0.61–1.24)
GFR calc Af Amer: 28 mL/min — ABNORMAL LOW (ref >60)
GFR, EST NON AFRICAN AMERICAN: 24 mL/min — AB (ref >60)
Glucose, Bld: 152 mg/dL — ABNORMAL HIGH (ref 65–99)
POTASSIUM: 3.6 mmol/L (ref 3.5–5.1)
SODIUM: 137 mmol/L (ref 135–145)

## 2016-01-09 LAB — GLUCOSE, CAPILLARY
GLUCOSE-CAPILLARY: 146 mg/dL — AB (ref 65–99)
GLUCOSE-CAPILLARY: 192 mg/dL — AB (ref 65–99)
Glucose-Capillary: 184 mg/dL — ABNORMAL HIGH (ref 65–99)
Glucose-Capillary: 210 mg/dL — ABNORMAL HIGH (ref 65–99)

## 2016-01-09 LAB — VANCOMYCIN, TROUGH: VANCOMYCIN TR: 45 ug/mL — AB (ref 15–20)

## 2016-01-09 MED ORDER — ALPRAZOLAM 0.25 MG PO TABS
0.2500 mg | ORAL_TABLET | Freq: Once | ORAL | Status: AC
  Administered 2016-01-09: 0.25 mg via ORAL
  Filled 2016-01-09: qty 1

## 2016-01-09 NOTE — Progress Notes (Signed)
ANTIBIOTIC CONSULT NOTE  Pharmacy has been consulted to dose vancomycin and piperazillin-tazobactam in this [age] yoM admitted with foot osteomyelitis.  Plan: Lab called pharmacy with critical result of vancomycin level at 45. Morning vancomycin dose was initiated at 0845 and lab was collected at 0927 so not a true trough. Will order trough prior to next dose and adjust accordingly.   Horris LatinoHolly Gilliam, PharmD Pharmacy Resident 01/09/2016 11:00 AM

## 2016-01-09 NOTE — Progress Notes (Addendum)
[facility] - [place] at [facility]   PATIENT NAME: Jesus Alvarado    MR#:  [id number]  DATE OF BIRTH:  06/06/1940  SUBJECTIVE:    no Acute events overnight.  REVIEW OF SYSTEMS:    Review of Systems  Constitutional: Negative.  Negative for chills, fever and malaise/fatigue.  HENT: Negative.  Negative for ear discharge, ear pain, hearing loss, nosebleeds and sore throat.   Eyes: Negative.  Negative for blurred vision and pain.  Respiratory: Negative.  Negative for cough, hemoptysis, shortness of breath and wheezing.   Cardiovascular: Negative.  Negative for chest pain, palpitations and leg swelling.  Gastrointestinal: Negative.  Negative for abdominal pain, blood in stool, diarrhea, nausea and vomiting.  Genitourinary: Negative.  Negative for dysuria.  Musculoskeletal: Negative.  Negative for back pain.  Neurological: Negative for dizziness, tremors, speech change, focal weakness, seizures and headaches.  Endo/Heme/Allergies: Negative.  Does not bruise/bleed easily.  Psychiatric/Behavioral: Negative.  Negative for depression, hallucinations and suicidal ideas.    Tolerating Diet:yes      DRUG ALLERGIES:   Allergies  Allergen Reactions  . Terazosin Hcl Other (See Comments)    Reaction:  Unknown     VITALS:  Blood pressure (!) 145/83, pulse 62, temperature 97.7 F (36.5 C), temperature source Oral, resp. rate 20, height 6\' 2"  (1.88 m), weight (!) 145.7 kg (321 lb 1.6 oz), SpO2 92 %.  PHYSICAL EXAMINATION:   Physical Exam  Constitutional: He is oriented to person, place, and time and well-developed, well-nourished, and in no distress. No distress.  HENT:  Head: Normocephalic.  Eyes: No scleral icterus.  Neck: Normal range of motion. Neck supple. No JVD present. No tracheal deviation present.  Cardiovascular: Normal rate, regular rhythm and normal heart sounds.  Exam reveals no gallop and no friction rub.   No murmur heard. Pulmonary/Chest: Effort normal  and breath sounds normal. No respiratory distress. He has no wheezes. He has no rales. He exhibits no tenderness.  Abdominal: Soft. Bowel sounds are normal. He exhibits no distension and no mass. There is no tenderness. There is no rebound and no guarding.  Musculoskeletal: Normal range of motion. He exhibits no edema.  Neurological: He is alert and oriented to person, place, and time.  Skin: Skin is warm.  Foot is dressed  Psychiatric: Affect and judgment normal.      LABORATORY PANEL:   CBC  Recent Labs Lab 01/08/16 0445  WBC 12.8*  HGB 12.0*  HCT 37.1*  PLT 256   ------------------------------------------------------------------------------------------------------------------  Chemistries   Recent Labs Lab 01/06/16 1254  01/09/16 0511  NA 142  < > 137  K 3.8  < > 3.6  CL 108  < > 103  CO2 26  < > 26  GLUCOSE 82  < > 152*  BUN 54*  < > 47*  CREATININE 2.37*  < > 2.46*  CALCIUM 8.4*  < > 7.7*  AST 18  --   --   ALT 20  --   --   ALKPHOS 45  --   --   BILITOT 0.6  --   --   < > = values in this interval not displayed. ------------------------------------------------------------------------------------------------------------------  Cardiac Enzymes No results for input(s): TROPONINI in the last 168 hours. ------------------------------------------------------------------------------------------------------------------  RADIOLOGY:  No results found.   ASSESSMENT AND PLAN:   75 year old male with a history of diabetes and chronic ulcerations of feet who presented with progression of ulceration of the left forefoot with evidence of osteomyelitis on  x-ray.   1. Osteomyelitis left fifth metatarsal: Patient is status post debridement and angioplasty Sterile Dressing needs to be changed every 2-3 days as per podiatry. Wound culture growing out multiple organisms. We'll follow-up with lab tomorrow for ID. Continue Zosyn and vancomycin.  2. Diabetes: Continue  sliding scale insulin, Lantus and ADA diet  3. Hyperlipidemia: Continue Zocor  4. BPH: Continue Flomax  5. Hypertension:Continue Norvasc and Coreg. 6. Acute on chronic kidney disease stage III: Creatinine is increasing. Hold nephrotoxic agents. Consult nephrology.  Management plans discussed with the patient and he is in agreement.  CODE STATUS: full  TOTAL TIME TAKING CARE OF THIS PATIENT: 30 minutes.     POSSIBLE D/C tomorrow, DEPENDING ON CLINICAL CONDITION.   Irene Mitcham M.D on 01/09/2016 at 11:26 AM  Between 7am to 6pm - Pager - [phone] After 6pm go to [email or web address] - Social research officer, governmentpassword EPAS [facility]  [facility]  Office  [phone]  CC: Primary care physician; PROVIDER NOT IN SYSTEM  Note: This dictation was prepared with Dragon dictation along with smaller phrase technology. Any transcriptional errors that result from this process are unintentional.

## 2016-01-09 NOTE — Progress Notes (Signed)
Notified Dr Renae GlossWieting of pt c/o difficulty breathing, and pt c/o anxiety and request for anxiety medication; Dr acknowledged, stated he would put in orders

## 2016-01-10 LAB — BASIC METABOLIC PANEL
ANION GAP: 6 (ref 5–15)
BUN: 45 mg/dL — AB (ref 6–20)
CALCIUM: 7.8 mg/dL — AB (ref 8.9–10.3)
CO2: 27 mmol/L (ref 22–32)
Chloride: 103 mmol/L (ref 101–111)
Creatinine, Ser: 2.43 mg/dL — ABNORMAL HIGH (ref 0.61–1.24)
GFR calc Af Amer: 29 mL/min — ABNORMAL LOW (ref >60)
GFR, EST NON AFRICAN AMERICAN: 25 mL/min — AB (ref >60)
GLUCOSE: 218 mg/dL — AB (ref 65–99)
POTASSIUM: 3.8 mmol/L (ref 3.5–5.1)
SODIUM: 136 mmol/L (ref 135–145)

## 2016-01-10 LAB — GLUCOSE, CAPILLARY: Glucose-Capillary: 183 mg/dL — ABNORMAL HIGH (ref 65–99)

## 2016-01-10 LAB — VANCOMYCIN, TROUGH: VANCOMYCIN TR: 34 ug/mL — AB (ref 15–20)

## 2016-01-10 MED ORDER — MAGNESIUM HYDROXIDE 400 MG/5ML PO SUSP
30.0000 mL | Freq: Every evening | ORAL | Status: DC | PRN
  Filled 2016-01-10: qty 30

## 2016-01-10 MED ORDER — MAGNESIUM HYDROXIDE 400 MG/5ML PO SUSP: 30.0000 mL | Freq: Two times a day (BID) | ORAL | Status: DC

## 2016-01-10 MED ORDER — CIPROFLOXACIN HCL 500 MG PO TABS: 500.0000 mg | ORAL_TABLET | Freq: Two times a day (BID) | ORAL | 0 refills | Status: DC

## 2016-01-10 MED ORDER — AMLODIPINE BESYLATE 10 MG PO TABS: 10.0000 mg | ORAL_TABLET | Freq: Every day | ORAL | 0 refills | Status: AC

## 2016-01-10 MED ORDER — AMOXICILLIN-POT CLAVULANATE 875-125 MG PO TABS: 1.0000 | ORAL_TABLET | Freq: Two times a day (BID) | ORAL | 0 refills | Status: DC

## 2016-01-10 MED ORDER — VANCOMYCIN HCL IN DEXTROSE 1-5 GM/200ML-% IV SOLN: 1000.0000 mg | INTRAVENOUS | Status: DC

## 2016-01-10 MED ORDER — CIPROFLOXACIN HCL 250 MG PO TABS: 250.0000 mg | ORAL_TABLET | Freq: Two times a day (BID) | ORAL | 0 refills | Status: AC

## 2016-01-10 MED ORDER — FLEET ENEMA 7-19 GM/118ML RE ENEM: 1.0000 | ENEMA | Freq: Once | RECTAL | Status: DC

## 2016-01-10 NOTE — Discharge Summary (Addendum)
[facility] - [place] at [facility]   PATIENT NAME: Jesus Alvarado    MR#:  [id number]  DATE OF BIRTH:  05/08/1940  DATE OF ADMISSION:  01/06/2016 ADMITTING PHYSICIAN: Ramonita LabAruna Gouru, MD  DATE OF DISCHARGE: 01/10/2016  PRIMARY CARE PHYSICIAN: PROVIDER NOT IN SYSTEM    ADMISSION DIAGNOSIS:  osteomyelitis lt metatarsal Left lower extremity angio    Left foot osteomyelitis PAD with ulceration PAD with ulceration  DISCHARGE DIAGNOSIS:  Active Problems:   Foot osteomyelitis, left (HCC)   SECONDARY DIAGNOSIS:   Past Medical History:  Diagnosis Date  . Barrett's esophagus   . DDD (degenerative disc disease), lumbar   . DDD (degenerative disc disease), lumbar   . Diabetes mellitus without complication (HCC)   . Gastric ulcer   . Glaucoma   . Hard of hearing   . History of spinal stenosis   . Hyperlipemia   . Hypertension   . Hypertension   . Nephrolithiasis   . Neuropathy (HCC)   . Peripheral neuropathy (HCC)   . Sarcoid [facility](HCC)     HOSPITAL COURSE:   [age] year old male with a history of diabetes and chronic ulcerations of feet who presented with progression of ulceration of the left forefoot with evidence of osteomyelitis on x-ray.   1. Osteomyelitis left fifth metatarsal: Patient is status post debridement and angioplasty Sterile Dressing needs to be changed every 2-3 days as per podiatry. Wound culture is growing Morganella and Enterobacter. He will be discharged on ciprofloxacin on Augmentin as per sensitivities. He was initially treated on Zosyn and vancomycin. He was evaluated by podiatry and infectious disease.  2. Diabetes: Patient will resume Lantus. Due to acute on chronic kidney disease glipizide has been discontinued for now. He will continue ADA diet  3. Hyperlipidemia: Continue Zocor  4. BPH: Continue Flomax  5. Hypertension:Continue Norvasc and Coreg. Lisinopril and HCTZ have been discontinued due to acute on chronic kidney disease 6.  Acute on chronic kidney disease stage III: Creatinine has remained about 2.4 despite discontinuing nephrotoxic agents. All medications are renally dose and nephrotoxic agents have been discontinued. He will need to follow up with nephrology as an outpatient. His urine output is adequate.  Renal ultrasound showed no evidence of hydronephrosis  7. Left lung mass: This is known. Patient was supposed to have an outpatient CT scan which is ordered by cardiologist. He will be referred to pulmonary for ongoing outpatient follow-up for this lung mass. It is noted in his medical record that he does have a history of sarcoidosis.   DISCHARGE CONDITIONS AND DIET:   Stable  Diabetic diet  CONSULTS OBTAINED:  Treatment Team:  Renford DillsGregory G Schnier, MD Linus Galasodd Cline, DPM Gwyneth RevelsJustin Fowler, DPM Mick Sellavid P Fitzgerald, MD  DRUG ALLERGIES:   Allergies  Allergen Reactions  . Terazosin Hcl Other (See Comments)    Reaction:  Unknown     DISCHARGE MEDICATIONS:   Current Discharge Medication List    START taking these medications   Details  amoxicillin-clavulanate (AUGMENTIN) 875-125 MG tablet Take 1 tablet by mouth 2 (two) times daily. Qty: 20 tablet, Refills: 0    ciprofloxacin (CIPRO) 500 MG tablet Take 1 tablet (500 mg total) by mouth 2 (two) times daily. Qty: 20 tablet, Refills: 0      CONTINUE these medications which have NOT CHANGED   Details  amLODipine (NORVASC) 5 MG tablet Take 1 tablet by mouth daily.    ascorbic acid (VITAMIN C) 500 MG tablet Take 500 mg by mouth 2 (  two) times daily.    aspirin EC 325 MG tablet Take 325 mg by mouth daily.    b complex vitamins tablet Take 1 tablet by mouth daily.    bismuth subsalicylate (PEPTO BISMOL) 262 MG chewable tablet Chew 524 mg by mouth 4 (four) times daily.    CALCIUM-MAGNESIUM-ZINC PO Take 1 tablet by mouth daily.    carvedilol (COREG) 12.5 MG tablet Take 12.5 mg by mouth 2 (two) times daily.    cholecalciferol (VITAMIN D) 1000 UNITS tablet  Take 5,000 Units by mouth daily.    CRANBERRY PO Take 1 tablet by mouth daily.    Flaxseed, Linseed, (FLAXSEED OIL PO) Take 1 capsule by mouth daily.    furosemide (LASIX) 20 MG tablet Take 1 tablet (20 mg total) by mouth daily. Qty: 7 tablet, Refills: 0    gabapentin (NEURONTIN) 100 MG capsule Take 300-500 mg by mouth 2 (two) times daily. Pt takes three capsules in the morning and five capsules in the evening.    GARLIC PO Take 1 capsule by mouth daily.    glipiZIDE (GLUCOTROL) 10 MG tablet Take 20 mg by mouth 2 (two) times daily.    hydrochlorothiazide (HYDRODIURIL) 25 MG tablet Take 12.5 mg by mouth daily.    insulin glargine (LANTUS) 100 UNIT/ML injection Inject 66 Units into the skin at bedtime.     liraglutide 18 MG/3ML SOPN Inject 1.2 mg into the skin daily.    lisinopril (PRINIVIL,ZESTRIL) 20 MG tablet Take 30 mg by mouth daily.    MELATONIN PO Take 1 tablet by mouth at bedtime.    Multiple Vitamin (MULTIVITAMIN WITH MINERALS) TABS tablet Take 1 tablet by mouth daily.    Multiple Vitamins-Minerals (MULTIVITAMIN PO) Take 1 tablet by mouth daily.    Omega-3 Fatty Acids (FISH OIL) 1000 MG CAPS Take 1 capsule by mouth 2 (two) times daily.    pyridOXINE (VITAMIN B-6) 50 MG tablet Take 50 mg by mouth daily.    sennosides-docusate sodium (SENOKOT-S) 8.6-50 MG tablet Take 1 tablet by mouth at bedtime.    simvastatin (ZOCOR) 20 MG tablet Take 10 mg by mouth at bedtime.    sucralfate (CARAFATE) 1 G tablet Take 1 g by mouth 2 (two) times daily.    tamsulosin (FLOMAX) 0.4 MG CAPS capsule Take 0.4 mg by mouth every evening.    vitamin E (VITAMIN E) 400 UNIT capsule Take 400 Units by mouth daily.              Today   CHIEF COMPLAINT:   No acute events overnight.   VITAL SIGNS:  Blood pressure 127/68, pulse (!) 56, temperature 98.1 F (36.7 C), temperature source Oral, resp. rate 18, height 6\' 2"  (1.88 m), weight (!) 145.7 kg (321 lb 1.6 oz), SpO2 96  %.   REVIEW OF SYSTEMS:  Review of Systems  Constitutional: Negative.  Negative for chills, fever and malaise/fatigue.  HENT: Negative.  Negative for ear discharge, ear pain, hearing loss, nosebleeds and sore throat.   Eyes: Negative.  Negative for blurred vision and pain.  Respiratory: Negative.  Negative for cough, hemoptysis, shortness of breath and wheezing.   Cardiovascular: Negative.  Negative for chest pain, palpitations and leg swelling.  Gastrointestinal: Negative.  Negative for abdominal pain, blood in stool, diarrhea, nausea and vomiting.  Genitourinary: Negative.  Negative for dysuria.  Musculoskeletal: Negative.  Negative for back pain.  Skin: Negative.   Neurological: Negative for dizziness, tremors, speech change, focal weakness, seizures and headaches.  Endo/Heme/Allergies: Negative.  Does not bruise/bleed easily.  Psychiatric/Behavioral: Negative.  Negative for depression, hallucinations and suicidal ideas.     PHYSICAL EXAMINATION:  GENERAL:  75 y.o.-year-old patient lying in the bed with no acute distress.  NECK:  Supple, no jugular venous distention. No thyroid enlargement, no tenderness.  LUNGS: Normal breath sounds bilaterally, no wheezing, rales,rhonchi  No use of accessory muscles of respiration.  CARDIOVASCULAR: S1, S2 normal. No murmurs, rubs, or gallops.  ABDOMEN: Soft, non-tender, non-distended. Bowel sounds present. No organomegaly or mass.  EXTREMITIES: No pedal edema, cyanosis, or clubbing. Bilateral hand clenched from agent orange he says PSYCHIATRIC: The patient is alert and oriented x 3.  SKIN: Foot is dressed DATA REVIEW:   CBC  Recent Labs Lab 01/08/16 0445  WBC 12.8*  HGB 12.0*  HCT 37.1*  PLT 256    Chemistries   Recent Labs Lab 01/06/16 1254  01/10/16 0330  NA 142  < > 136  K 3.8  < > 3.8  CL 108  < > 103  CO2 26  < > 27  GLUCOSE 82  < > 218*  BUN 54*  < > 45*  CREATININE 2.37*  < > 2.43*  CALCIUM 8.4*  < > 7.8*  AST 18   --   --   ALT 20  --   --   ALKPHOS 45  --   --   BILITOT 0.6  --   --   < > = values in this interval not displayed.  Cardiac Enzymes No results for input(s): TROPONINI in the last 168 hours.  Microbiology Results  @MICRORSLT48 @  RADIOLOGY:  Dg Chest 1 View  Result Date: 01/10/2016 CLINICAL DATA:  75 year old male with shortness of breath. Left-sided lung mass. EXAM: CHEST 1 VIEW COMPARISON:  Chest radiograph dated 11/09/2015 and CT dated 07/12/2007 FINDINGS: There is mild eventration of the left hemidiaphragm. Left lung base density may represent atelectasis versus infiltrate. Underlying mass is not excluded. Clinical correlation and follow-up recommended. The right lung is clear. No significant pleural effusion or pneumothorax. The cardiac borders are silhouetted but grossly similar to the prior radiograph. Multiple surgical clips noted over the mediastinum in stable positioning as prior radiograph. Lower cervical an upper thoracic fusion hardware similar to prior radiograph. No acute fracture. IMPRESSION: Mild eventration of the left hemidiaphragm with left lung base atelectasis versus infiltrate. These findings are relatively similar or slightly increased since the prior radiograph of 11/09/2015. Underlying mass is not excluded. Clinical correlation and follow-up recommended. Electronically Signed   By: Elgie CollardArash  Radparvar M.D.   On: 01/10/2016 00:07   [place] Renal  Result Date: 01/09/2016 CLINICAL DATA:  Acute renal failure, history of kidney stones EXAM: RENAL / URINARY TRACT ULTRASOUND COMPLETE COMPARISON:  None. FINDINGS: Right Kidney: Length: 12.4 cm.  No mass or hydronephrosis. Left Kidney: Length: 13.1 cm.  No mass or hydronephrosis. Bladder: Within normal limits. IMPRESSION: Negative renal ultrasound. Electronically Signed   By: Charline BillsSriyesh  Krishnan M.D.   On: 01/09/2016 13:55      Management plans discussed with the patient and he is in agreement. Stable for discharge home  Patient  should follow up with podiatry and pcp/pulmonary  CODE STATUS:     Code Status Orders        Start     Ordered   01/06/16 1224  Full code  Continuous     01/06/16 1223    Code Status History    Date Active Date Inactive Code Status Order ID Comments User  Context   This patient has a current code status but no historical code status.    Advance Directive Documentation   Flowsheet Row Most Recent Value  Type of Advance Directive  Healthcare Power of Attorney  Pre-existing out of facility DNR order (yellow form or pink MOST form)  No data  "MOST" Form in Place?  No data      TOTAL TIME TAKING CARE OF THIS PATIENT: 39 minutes.    Note: This dictation was prepared with Dragon dictation along with smaller phrase technology. Any transcriptional errors that result from this process are unintentional.  Sanvi Ehler M.D on 01/10/2016 at 9:28 AM  Between 7am to 6pm - Pager - [phone] After 6pm go to [email or web address] - password [email or web address]  [facility]  Office  [phone]  CC: Primary care physician; PROVIDER NOT IN SYSTEM

## 2016-01-10 NOTE — Care Management Note (Signed)
Case Management Note  Patient Details  Name: Jesus Alvarado MRN: [id number] Date of Birth: 02/09/1941  Subjective/Objective:     Jesus Alvarado did not qualify for new home oxygen today. A referral was called to Mood[place]heryl at [facility] to resume Home health Services and that there is a wound care order.              Action/Plan:   Expected Discharge Date:                  Expected Discharge Plan:     In-House Referral:     Discharge planning Services     Post Acute Care Choice:    Choice offered to:     DME Arranged:    DME Agency:     HH Arranged:    HH Agency:     Status of Service:     If discussed at [employer] Length of Stay Meetings, dates discussed:    Additional Comments:  Jenia Klepper A, RN 01/10/2016, 11:27 AM

## 2016-01-10 NOTE — Progress Notes (Signed)
Pt to be discharged per MD order. IV removed. Instructions reviewed with pt and family friend. All questions answered. Spoke with pt POA Abbie and updated her on plan of care. Per Lyn from case management PT and HH Rn have been set up for tomorrow.  Dressing on foot clean, dry and intact. Taken out in wheelchair.

## 2016-01-10 NOTE — Progress Notes (Signed)
ANTIBIOTIC CONSULT NOTE  Pharmacy has been consulted to dose vancomycin and piperazillin-tazobactam in this [age] yoM admitted with foot osteomyelitis.  Plan: 10/29 0330 vancomycin trough 34 mcg/mL. Dose has not been charted against. Recalculated Ke 0.026 hr-1, T1/2 27 hr. Resume dosing 01/11/16 at 0800 (estimated Css 17 mcg/mL) vancomycin 1 gm IV Q24H, predicted trough 16 mcg/mL. Pharmacy will continue to follow and adjust as needed to maintain trough 15 to 20 mcg/mL.    Raahi Korber A. [place], [place].D., BCPS Clinical Pharmacist 01/10/2016 4:07 AM

## 2016-01-11 ENCOUNTER — Encounter: Payer: Self-pay | Admitting: Vascular Surgery

## 2016-01-11 LAB — SURGICAL PATHOLOGY

## 2016-01-12 LAB — AEROBIC/ANAEROBIC CULTURE W GRAM STAIN (SURGICAL/DEEP WOUND)

## 2016-01-12 LAB — AEROBIC/ANAEROBIC CULTURE (SURGICAL/DEEP WOUND)

## 2016-01-18 NOTE — Progress Notes (Signed)
[facility]      Assessment and Plan:  Left lower lobe atelectasis with pneumonia, and left diaphragmatic paralysis.Marland Kitchen.  --Will give course of levaquin.  -I see no evidence of a "lung mass" as suggested in the radiology report. We'll continue surveillance with repeat chest x-ray, given his severe orthopnea. I did not think that we can do a CAT scan. -Given his multiple comorbidities, even if he does have lung cancer would be unlikely that he could tolerate treatments, and addition, given his frailty, I doubt that we could perform a diagnostic biopsy on him.   Sarcoidosis.  -Remote history of sarcoidosis, patient had a previous surgery proximal to 40 years ago. -We'll check his Ace level.  Dyspnea/ acute on chronic respiratory failure.  -Appears multifactorial due to deconditioning/debility, left diaphragmatic paralysis with atelectasis, severe volume overload.  Orthopnea -Due to left lung paralysis, volume overload, obesity. -The patient was laid down in the office, he tolerated approximately 30 seconds in the supine position before he became very short of breath and asked to be set back up.  Sleep apnea -History of sleep apnea, however, he is not able to use CPAP due to bilateral hand paralysis.  Date: 01/18/2016  MRN# [id number] Jesus Alvarado 08/24/1940  Referring Physician:   Edwena BundeLeighton L Alvarado is a 75 y.o. old male seen in consultation for chief complaint of:    Chief Complaint  Patient presents with  . Hospitalization Follow-up    pt discharged 01-10-16. pt states breathing is baseline since being discharged. pt c/o sob with exertion,weakness, coughing up blood X2d & wheezing mainly with laying flat    HPI:   Patient is a 75 year old male, he was recently discharged from the hospital on 01/10/16 with left lower extremity osteomyelitis, status post debridement. He is asked to follow up outpatient in the pulmonary office, as per discharge summary  from 01/10/16, the patient has an apparent history of lung mass and sarcoidosis. Per a radiology note, which makes note of left lower lobe atelectasis, and mass could not be "ruled out".  He is present with his home health aide who gives some of the history. They note that he has trouble breathing when laying flat. He has been coughing up some mucus over the past few days, it has a few streaks of blood in it.  They note that his breathing is short at rest. He is not a smoker.  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 He has never been on treatment for this, he notes that several people in the area had the same problem [facility].   He is very limited in his walking due to foot surgery.  He was diagnosed with sleep ap[facility] the [facility] [place] [facility]. He was started on CPAP, but has hand paralysis and could not manipulate the the mask to put it on or take it off.   Review of imaging: Chest x-ray 01/09/16: Elevated left diaphragm, right middle lobe, LLL atelectasis. On review of old imaging, it appears that his left diaphragmatic paralysis occurred summer between 2009 in 2011. CT chest/30/2009: Few tiny nodules, otherwise unremarkable. Lungs.   PMHX:   Past Medical History:  Diagnosis Date  . Barrett's esophagus   . DDD (degenerative disc disease), lumbar   . DDD (degenerative  disc disease), lumbar   . Diabetes mellitus without complication (HCC)   . Gastric ulcer   . Glaucoma   . Hard of hearing   . History of spinal stenosis   . Hyperlipemia   . Hypertension   . Hypertension   . Nephrolithiasis   . Neuropathy (HCC)   . Peripheral neuropathy (HCC)   . Sarcoid [facility](HCC)    Surgical Hx:  Past Surgical History:  Procedure Laterality Date  . APPENDECTOMY    . BACK SURGERY    . carpel tunnel    . CATARACT EXTRACTION    . IRRIGATION AND DEBRIDEMENT FOOT  Left 01/07/2016   Procedure: IRRIGATION AND DEBRIDEMENT FOOT;  Surgeon: Linus Galasodd Cline, DPM;  Location: [facility] ORS;  Service: Podiatry;  Laterality: Left;  . LYMPH NODE DISSECTION    . NECK SURGERY    . ORIF ANKLE FRACTURE    . PERIPHERAL VASCULAR CATHETERIZATION Right 01/08/2016   Procedure: Lower Extremity Angiography;  Surgeon: Annice NeedyJason S Dew, MD;  Location: [facility] INVASIVE CV LAB;  Service: Cardiovascular;  Laterality: Right;   Family Hx:  Family History  Problem Relation Age of Onset  . Alcohol abuse Father    Social Hx:   Social History  Substance Use Topics  . Smoking status: Never Smoker  . Smokeless tobacco: Never Used  . Alcohol use No   Medication:   Reviewed.     Allergies:  Terazosin hcl  Review of Systems: Gen:  Denies  fever, sweats, chills HEENT: Denies blurred vision, double vision. bleeds, sore throat Cvc:  No dizziness, chest pain. Resp:   Denies pleuritic chest pain.  Gi: Denies swallowing difficulty, stomach pain. Gu:  Denies bladder incontinence, burning urine Ext:   No Joint pain, stiffness. Skin: No skin rash,  hives  Endoc:  No polyuria, polydipsia. Psych: No depression, insomnia. Other:  All other systems were reviewed with the patient and were negative other that what is mentioned in the HPI.   Physical Examination:   VS: BP 140/78 (BP Location: Left Arm, Cuff Size: Normal)   Pulse 70   Ht 6\' 2"  (1.88 m)   Wt 282 lb (127.9 kg)   SpO2 98%   BMI 36.21 kg/m   General Appearance: No distress  Neuro:without focal findings,  speech normal,  HEENT: PERRLA, EOM intact.   Pulmonary: normal breath sounds, decreased air entry in left lung.   CardiovascularNormal S1,S2.  No m/r/g.   Abdomen: Benign, Soft, non-tender. Renal:  No costovertebral tenderness  GU:  No performed at this time. Endoc: No evident thyromegaly, no signs of acromegaly. Skin:   warm, no rashes, no ecchymosis  Extremities: 4+ LE edema.   Other findings:    LABORATORY PANEL:    CBC No results for input(s): WBC, HGB, HCT, PLT in the last 168 hours. ------------------------------------------------------------------------------------------------------------------  Chemistries  No results for input(s): NA, K, CL, CO2, GLUCOSE, BUN, CREATININE, CALCIUM, MG, AST, ALT, ALKPHOS, BILITOT in the last 168 hours.  Invalid input(s): GFRCGP ------------------------------------------------------------------------------------------------------------------  Cardiac Enzymes No results for input(s): TROPONINI in the last 168 hours. ------------------------------------------------------------  RADIOLOGY:  No results found.     Thank  you for the consultation and for allowing [facility] to assist in the care of your patient. Our recommendations are noted above.  Please contact us if we can be of further service.   Wells Guileseep Edelin Fryer, MD.  Board Certified in Internal Medicine, Pulmonary Medicine, Critical Care Medicine, and Sleep Medicine.  [facility]  Office Number: [phone]  Santiago Glad, M.D.  Stephanie Acre, M.D.  Billy Fischer, M.D  01/18/2016

## 2016-01-21 ENCOUNTER — Other Ambulatory Visit
Admission: RE | Admit: 2016-01-21 | Discharge: 2016-01-21 | Disposition: A | Discharge status: Home / Self Care | Payer: Medicare Other | Source: Ambulatory Visit | Attending: Internal Medicine | Admitting: Internal Medicine

## 2016-01-21 ENCOUNTER — Ambulatory Visit (INDEPENDENT_AMBULATORY_CARE_PROVIDER_SITE_OTHER): Payer: Medicare Other | Admitting: Internal Medicine

## 2016-01-21 ENCOUNTER — Encounter: Payer: Self-pay | Admitting: Internal Medicine

## 2016-01-21 VITALS — BP 140/78 | HR 70 | Ht 74.0 in | Wt 282.0 lb

## 2016-01-21 DIAGNOSIS — D869 Sarcoidosis, unspecified: Secondary | ICD-10-CM | POA: Insufficient documentation

## 2016-01-21 MED ORDER — AMBULATORY NON FORMULARY MEDICATION: 0 refills | Status: AC

## 2016-01-21 NOTE — Patient Instructions (Addendum)
--  Will prescribe incentive spirometer (the "ping-pong ball thing") use is as often as possible during the day.   --Will continue current abx.   --Will check a full pulmonary function test and 2 view CXR before next visit in 3 months.   --Will check ACE blood test for sarcoidosis.

## 2016-01-22 LAB — ANGIOTENSIN CONVERTING ENZYME: Angiotensin-Converting Enzyme: 26 U/L (ref 14–82)

## 2016-02-12 DEATH — deceased

## 2017-07-19 ENCOUNTER — Telehealth: Payer: Self-pay | Admitting: Internal Medicine

## 2017-07-19 ENCOUNTER — Encounter: Payer: Self-pay | Admitting: Internal Medicine

## 2017-07-19 NOTE — Telephone Encounter (Signed)
3 attempts to schedule fu appt from recall list.  Mailed Letter. Deleting recall.  ° °
